# Patient Record
Sex: Male | Born: 1960 | Race: Black or African American | Hispanic: No | Marital: Married | Smoking: Former smoker
Health system: Southern US, Community
[De-identification: ages and names within clinical notes are randomized; demographics above are authoritative.]

## PROBLEM LIST (undated history)

## (undated) DIAGNOSIS — D376 Neoplasm of uncertain behavior of liver, gallbladder and bile ducts: Secondary | ICD-10-CM

## (undated) DIAGNOSIS — K648 Other hemorrhoids: Secondary | ICD-10-CM

## (undated) DIAGNOSIS — E291 Testicular hypofunction: Secondary | ICD-10-CM

## (undated) DIAGNOSIS — I2510 Atherosclerotic heart disease of native coronary artery without angina pectoris: Secondary | ICD-10-CM

## (undated) DIAGNOSIS — G4733 Obstructive sleep apnea (adult) (pediatric): Secondary | ICD-10-CM

## (undated) DIAGNOSIS — E785 Hyperlipidemia, unspecified: Secondary | ICD-10-CM

## (undated) DIAGNOSIS — K649 Unspecified hemorrhoids: Secondary | ICD-10-CM

## (undated) DIAGNOSIS — K219 Gastro-esophageal reflux disease without esophagitis: Secondary | ICD-10-CM

## (undated) DIAGNOSIS — K5730 Diverticulosis of large intestine without perforation or abscess without bleeding: Secondary | ICD-10-CM

## (undated) DIAGNOSIS — I10 Essential (primary) hypertension: Secondary | ICD-10-CM

## (undated) DIAGNOSIS — E669 Obesity, unspecified: Secondary | ICD-10-CM

## (undated) DIAGNOSIS — I219 Acute myocardial infarction, unspecified: Secondary | ICD-10-CM

## (undated) HISTORY — DX: Acute myocardial infarction, unspecified: I21.9

## (undated) HISTORY — DX: Obstructive sleep apnea (adult) (pediatric): G47.33

## (undated) HISTORY — DX: Testicular hypofunction: E29.1

## (undated) HISTORY — DX: Diverticulosis of large intestine without perforation or abscess without bleeding: K57.30

## (undated) HISTORY — PX: COLONOSCOPY: SHX174

## (undated) HISTORY — DX: Essential (primary) hypertension: I10

## (undated) HISTORY — DX: Other hemorrhoids: K64.8

## (undated) HISTORY — DX: Obesity, unspecified: E66.9

## (undated) HISTORY — PX: CHOLECYSTECTOMY: SHX55

## (undated) HISTORY — DX: Hyperlipidemia, unspecified: E78.5

## (undated) HISTORY — DX: Gastro-esophageal reflux disease without esophagitis: K21.9

## (undated) HISTORY — DX: Atherosclerotic heart disease of native coronary artery without angina pectoris: I25.10

## (undated) HISTORY — DX: Neoplasm of uncertain behavior of liver, gallbladder and bile ducts: D37.6

## (undated) HISTORY — DX: Unspecified hemorrhoids: K64.9

---

## 2005-08-25 HISTORY — PX: CORONARY ANGIOPLASTY WITH STENT PLACEMENT: SHX49

## 2006-06-25 DIAGNOSIS — I2510 Atherosclerotic heart disease of native coronary artery without angina pectoris: Secondary | ICD-10-CM | POA: Insufficient documentation

## 2006-06-25 DIAGNOSIS — Z9861 Coronary angioplasty status: Secondary | ICD-10-CM

## 2006-07-07 ENCOUNTER — Inpatient Hospital Stay (HOSPITAL_COMMUNITY): Admission: RE | Admit: 2006-07-07 | Discharge: 2006-07-08 | Payer: Self-pay | Admitting: Cardiology

## 2006-07-07 ENCOUNTER — Ambulatory Visit: Payer: Self-pay | Admitting: Cardiology

## 2006-07-10 ENCOUNTER — Ambulatory Visit: Payer: Self-pay | Admitting: *Deleted

## 2006-07-11 ENCOUNTER — Inpatient Hospital Stay (HOSPITAL_COMMUNITY): Admission: EM | Admit: 2006-07-11 | Discharge: 2006-07-12 | Payer: Self-pay | Admitting: Emergency Medicine

## 2006-07-23 ENCOUNTER — Encounter (HOSPITAL_COMMUNITY): Admission: RE | Admit: 2006-07-23 | Discharge: 2006-10-21 | Payer: Self-pay | Admitting: Cardiology

## 2006-08-03 ENCOUNTER — Ambulatory Visit: Payer: Self-pay | Admitting: Cardiology

## 2006-09-18 ENCOUNTER — Encounter: Payer: Self-pay | Admitting: Cardiology

## 2006-09-18 ENCOUNTER — Ambulatory Visit: Payer: Self-pay

## 2006-10-15 ENCOUNTER — Ambulatory Visit: Payer: Self-pay | Admitting: Cardiology

## 2007-05-24 ENCOUNTER — Ambulatory Visit: Payer: Self-pay | Admitting: Cardiology

## 2007-06-21 ENCOUNTER — Ambulatory Visit: Payer: Self-pay | Admitting: Cardiology

## 2007-06-24 ENCOUNTER — Ambulatory Visit: Payer: Self-pay

## 2008-01-10 ENCOUNTER — Ambulatory Visit: Payer: Self-pay | Admitting: Cardiology

## 2008-01-24 ENCOUNTER — Encounter: Payer: Self-pay | Admitting: Emergency Medicine

## 2008-01-24 ENCOUNTER — Inpatient Hospital Stay (HOSPITAL_COMMUNITY): Admission: AD | Admit: 2008-01-24 | Discharge: 2008-01-26 | Payer: Self-pay | Admitting: Internal Medicine

## 2008-01-24 ENCOUNTER — Encounter: Payer: Self-pay | Admitting: Internal Medicine

## 2008-01-24 DIAGNOSIS — D376 Neoplasm of uncertain behavior of liver, gallbladder and bile ducts: Secondary | ICD-10-CM | POA: Insufficient documentation

## 2008-01-24 DIAGNOSIS — K5732 Diverticulitis of large intestine without perforation or abscess without bleeding: Secondary | ICD-10-CM | POA: Insufficient documentation

## 2008-06-07 ENCOUNTER — Ambulatory Visit: Payer: Self-pay | Admitting: Cardiology

## 2008-07-10 ENCOUNTER — Ambulatory Visit: Payer: Self-pay | Admitting: Internal Medicine

## 2008-07-12 ENCOUNTER — Telehealth: Payer: Self-pay | Admitting: Internal Medicine

## 2008-07-18 ENCOUNTER — Encounter: Payer: Self-pay | Admitting: Internal Medicine

## 2008-07-31 ENCOUNTER — Emergency Department (HOSPITAL_BASED_OUTPATIENT_CLINIC_OR_DEPARTMENT_OTHER): Admission: EM | Admit: 2008-07-31 | Discharge: 2008-07-31 | Payer: Self-pay | Admitting: Emergency Medicine

## 2009-04-04 ENCOUNTER — Encounter (INDEPENDENT_AMBULATORY_CARE_PROVIDER_SITE_OTHER): Payer: Self-pay | Admitting: *Deleted

## 2009-05-12 ENCOUNTER — Ambulatory Visit: Payer: Self-pay | Admitting: Diagnostic Radiology

## 2009-05-12 ENCOUNTER — Emergency Department (HOSPITAL_BASED_OUTPATIENT_CLINIC_OR_DEPARTMENT_OTHER): Admission: EM | Admit: 2009-05-12 | Discharge: 2009-05-12 | Payer: Self-pay | Admitting: Emergency Medicine

## 2009-05-22 ENCOUNTER — Encounter: Payer: Self-pay | Admitting: Cardiology

## 2009-05-25 DIAGNOSIS — K219 Gastro-esophageal reflux disease without esophagitis: Secondary | ICD-10-CM | POA: Insufficient documentation

## 2009-05-25 DIAGNOSIS — I10 Essential (primary) hypertension: Secondary | ICD-10-CM | POA: Insufficient documentation

## 2009-05-25 DIAGNOSIS — E669 Obesity, unspecified: Secondary | ICD-10-CM | POA: Insufficient documentation

## 2009-05-25 DIAGNOSIS — I219 Acute myocardial infarction, unspecified: Secondary | ICD-10-CM | POA: Insufficient documentation

## 2009-05-30 ENCOUNTER — Encounter: Payer: Self-pay | Admitting: Cardiology

## 2009-05-30 ENCOUNTER — Ambulatory Visit: Payer: Self-pay | Admitting: Cardiology

## 2009-06-01 ENCOUNTER — Telehealth: Payer: Self-pay | Admitting: Cardiology

## 2009-06-04 LAB — CONVERTED CEMR LAB
ALT: 44 units/L (ref 0–53)
AST: 33 units/L (ref 0–37)
Albumin: 4.4 g/dL (ref 3.5–5.2)
Alkaline Phosphatase: 97 units/L (ref 39–117)
CO2: 25 meq/L (ref 19–32)
Calcium: 9.8 mg/dL (ref 8.4–10.5)
Glucose, Bld: 103 mg/dL — ABNORMAL HIGH (ref 70–99)
Potassium: 4.9 meq/L (ref 3.5–5.3)
Sodium: 141 meq/L (ref 135–145)
Total Protein: 7.5 g/dL (ref 6.0–8.3)

## 2009-07-03 ENCOUNTER — Telehealth: Payer: Self-pay | Admitting: Cardiology

## 2010-03-27 ENCOUNTER — Telehealth: Payer: Self-pay | Admitting: Cardiology

## 2010-05-27 ENCOUNTER — Encounter: Payer: Self-pay | Admitting: Emergency Medicine

## 2010-05-27 ENCOUNTER — Ambulatory Visit: Payer: Self-pay | Admitting: Diagnostic Radiology

## 2010-05-28 ENCOUNTER — Ambulatory Visit: Payer: Self-pay | Admitting: Internal Medicine

## 2010-05-28 ENCOUNTER — Observation Stay (HOSPITAL_COMMUNITY): Admission: EM | Admit: 2010-05-28 | Discharge: 2010-05-28 | Payer: Self-pay | Admitting: Cardiology

## 2010-06-03 ENCOUNTER — Telehealth (INDEPENDENT_AMBULATORY_CARE_PROVIDER_SITE_OTHER): Payer: Self-pay | Admitting: Radiology

## 2010-06-04 ENCOUNTER — Encounter: Payer: Self-pay | Admitting: Internal Medicine

## 2010-06-04 ENCOUNTER — Ambulatory Visit: Payer: Self-pay

## 2010-06-04 ENCOUNTER — Encounter (HOSPITAL_COMMUNITY)
Admission: RE | Admit: 2010-06-04 | Discharge: 2010-06-21 | Payer: Self-pay | Source: Home / Self Care | Admitting: Cardiology

## 2010-06-04 ENCOUNTER — Ambulatory Visit: Payer: Self-pay | Admitting: Internal Medicine

## 2010-06-19 ENCOUNTER — Encounter: Payer: Self-pay | Admitting: Cardiology

## 2010-06-19 ENCOUNTER — Ambulatory Visit: Payer: Self-pay | Admitting: Cardiology

## 2010-08-01 ENCOUNTER — Emergency Department (HOSPITAL_BASED_OUTPATIENT_CLINIC_OR_DEPARTMENT_OTHER): Admission: EM | Admit: 2010-08-01 | Discharge: 2009-11-06 | Payer: Self-pay | Admitting: Emergency Medicine

## 2010-08-08 ENCOUNTER — Telehealth: Payer: Self-pay | Admitting: Internal Medicine

## 2010-09-15 ENCOUNTER — Encounter: Payer: Self-pay | Admitting: Internal Medicine

## 2010-09-24 NOTE — Assessment & Plan Note (Signed)
Summary: Henry Perry   Visit Type:  Follow-up Primary Provider:  [facility]  CC:  No complaints.  History of Present Illness: Mr. Dennington is a pleasant 50 year old male with cardiac history dating back to November 2007.  He underwent cardiac catheterization at that time secondary to a myocardial infarction.  He was found to have an ejection fraction of 65%.  He had a proximal 95% stenosis in his right coronary artery and the distal vessel was occluded.  He subsequently underwent PCI of his right coronary artery using 3 drug-eluting stents, 2 of which were overlapping.  He had nonobstructive disease in his LAD with a 50% mid and a 40% lesion further downstream.  He had a 50% second diagonal as well.  Admitted to [facility] 10/11 with atypical chest pain. A Myoview was performed following DC in October of 2011.  At that time, his ejection fraction was 56%. No ischemia or infarction. Last echocardiogram in January of 2008 revealed normal LV function. Since then he denies any dyspnea on exertion, orthopnea, PND, pedal edema, palpitations, syncope or chest pain.  Current Medications (verified): 1)  Plavix 75 Mg Tabs (Clopidogrel Bisulfate) .... Once Daily 2)  Simvastatin 80 Mg Tabs (Simvastatin) .... Take 1/2 Tablet Daily 3)  Carvedilol 25 Mg Tabs (Carvedilol) .... Two Times A Day 4)  Aspirin 81 Mg Tabs (Aspirin) .... Once Daily 5)  Lisinopril 20 Mg Tabs (Lisinopril) .... Take 1 Tablet By Mouth Once A Day 6)  Metformin Hcl 500 Mg Tabs (Metformin Hcl) .... Take 1 Tablet By Mouth Two Times A Day 7)  Clear-Atadine D 10-240 Mg Xr24h-Tab (Loratadine-Pseudoephedrine) .... As Needed  Allergies (verified): No Known Drug Allergies  Past History:  Past Medical History: HYPERLIPIDEMIA (ICD-272.4) HYPERTENSION (ICD-401.9) CAD (ICD-414.00) MI (ICD-410.90) Hx of DIVERTICULITIS, ACUTE (ICD-562.11) LIVER MASS (ICD-235.3) HEMORRHOIDS (ICD-455.6) GERD (ICD-530.81) OBESITY  (ICD-278.00) diabetes non insulin dependent  Past Surgical History: Cholecystectomy  Social History: Reviewed history from 07/10/2008 and no changes required. 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  Alcohol Use - no Daily Caffeine Use -1 Illicit Drug Use - no Married, 1 son, 1 daughter  Review of Systems       no fevers or chills, productive cough, hemoptysis, dysphasia, odynophagia, melena, hematochezia, dysuria, hematuria, rash, seizure activity, orthopnea, PND, pedal edema, claudication. Remaining systems are negative.   Vital Signs:  Patient profile:   50 year old male Height:      67 inches Weight:      220.75 pounds BMI:     34.70 Pulse rate:   72 / minute Pulse rhythm:   regular Resp:     18 per minute BP sitting:   116 / 80  (left arm) Cuff size:   large  Vitals Entered By: Vikki Ports (June 19, 2010 9:44 AM)  Physical Exam  General:  Well-developed well-nourished in no acute distress.  Skin is warm and dry.  HEENT is normal.  Neck is supple. No thyromegaly.  Chest is clear to auscultation with normal expansion.  Cardiovascular exam is regular rate and rhythm.  Abdominal exam nontender or distended. No masses palpated. Extremities show no edema. neuro grossly intact    Impression & Recommendations:  Problem # 1:  HYPERLIPIDEMIA (ICD-272.4) Continue statin. His updated medication list for this problem includes:    Simvastatin 80 Mg Tabs (Simvastatin) .Marland Kitchen... Take 1/2 tablet daily  Problem # 2:  HYPERTENSION (ICD-401.9) Blood pressure controlled on present medications. Will continue. His updated medication list  for this problem includes:    Carvedilol 25 Mg Tabs (Carvedilol) .Marland Kitchen..Marland Kitchen Two times a day    Aspirin 81 Mg Tabs (Aspirin) ..... Once daily    Lisinopril 20 Mg Tabs (Lisinopril) .Marland Kitchen... Take 1 tablet by mouth once a day  Problem # 3:  CAD (ICD-414.00) Continue aspirin, Plavix, beta blocker, ACE inhibitor and  statin. His updated medication list for this problem includes:    Plavix 75 Mg Tabs (Clopidogrel bisulfate) ..... Once daily    Carvedilol 25 Mg Tabs (Carvedilol) .Marland Kitchen..Marland Kitchen Two times a day    Aspirin 81 Mg Tabs (Aspirin) ..... Once daily    Lisinopril 20 Mg Tabs (Lisinopril) .Marland Kitchen... Take 1 tablet by mouth once a day  Problem # 4:  GERD (ICD-530.81)  Patient Instructions: 1)  Your physician recommends that you schedule a follow-up appointment in: 6 MONTHS

## 2010-09-24 NOTE — Assessment & Plan Note (Signed)
Summary: Cardiology Nuclear Testing  Nuclear Med Background Indications for Stress Test: Evaluation for Ischemia, Post Hospital  Indications Comments: 05/28/10 [facility] Chest pain  History: Echo, Myocardial Infarction, Myocardial Perfusion Study, Stents  History Comments: '08 [facility]: No ischemia.  Symptoms: Chest Pain  Symptoms Comments: Last episode of RJ:JOAC since d/c   Nuclear Pre-Procedure Cardiac Risk Factors: History of Smoking, Hypertension, Lipids, NIDDM Caffeine/Decaff Intake: NONE NPO After: 7:30 PM Lungs: Clear IV 0.9% NS with Angio Cath: 22g     IV Site: R Hand IV Started by: Doyne Keel, CNMT Chest Size (in) 48     Height (in): 67 Weight (lb): 215 BMI: 33.80 Tech Comments: HELD COREG SINCE 3P YESTERDAY/ TOOK METFORMIN THIS AM  Nuclear Med Study 1 or 2 day study:  1 day     Stress Test Type:  Stress Reading MD:  Arvilla Meres, MD     Referring MD:  Olga Millers, MD Resting Radionuclide:  Technetium 13m Tetrofosmin     Resting Radionuclide Dose:  10.9 mCi  Stress Radionuclide:  Technetium 22m Tetrofosmin     Stress Radionuclide Dose:  33.0 mCi   Stress Protocol Exercise Time (min):  10:46 min     Max HR:  162 bpm     Predicted Max HR:  174 bpm  Max Systolic BP: 211 mm Hg     Percent Max HR:  93.10 %     METS: 12.8 Rate Pressure Product:  16606    Stress Test Technologist:  Cathlyn Parsons, RN     Nuclear Technologist:  Domenic Polite, CNMT  Rest Procedure  Myocardial perfusion imaging was performed at rest 45 minutes following the intravenous administration of Technetium 29m Tetrofosmin.  Stress Procedure  The patient exercised for 10:46.  The patient stopped due to fatigue and denied any chest pain.  There were no significant ST-T wave changes.  He did have a hypertensive response to exercise, 211/94 and 180/112.  He had held his Coreg x 18 hours.  Technetium 16m Tetrofosmin was injected at peak exercise and myocardial perfusion imaging was performed after  a brief delay.  QPS Raw Data Images:  Normal; no motion artifact; normal heart/lung ratio. Stress Images:  Normal homogeneous uptake in all areas of the myocardium. Rest Images:  Normal homogeneous uptake in all areas of the myocardium. Subtraction (SDS):  Normal Transient Ischemic Dilatation:  Marland KitchenMarland Kitchen98  (Normal <1.22)  Lung/Heart Ratio:  .31  (Normal <0.45)  Quantitative Gated Spect Images QGS EDV:  100 ml QGS ESV:  44 ml QGS EF:  56 % QGS cine images:  Normal  Findings Normal nuclear study      Overall Impression  Exercise Capacity: Good exercise capacity. BP Response: Hypertensive blood pressure response. Clinical Symptoms: No chest pain. + dyspnea ECG Impression: No significant ST segment change suggestive of ischemia. Overall Impression: Normal stress nuclear study.  Appended Document: Cardiology Nuclear Testing ok  Appended Document: Cardiology Nuclear Testing lmtcb./cy  Appended Document: Cardiology Nuclear Testing pt aware./cy

## 2010-09-24 NOTE — Progress Notes (Signed)
Summary: Nuc Pre-Procedure  Phone Note Outgoing Call Call back at Home Phone [phone]   Call placed by: Langley Adie Call placed to: Patient Reason for Call: Confirm/change Appt Summary of Call: Left message with information on Myoview Information Sheet (see scanned document for details).      Nuclear Med Background Indications for Stress Test: Evaluation for Ischemia, Post Hospital  Indications Comments: [facility] Chest [facility]  History: Echo, Myocardial Infarction, Myocardial Perfusion Study, Stents  History Comments: 2007- MI treated with Strent-RCA 2008- [facility]- No ischemia. Nl. EF  Symptoms: Chest Pain    Nuclear Pre-Procedure Cardiac Risk Factors: History of Smoking, Hypertension, Lipids, NIDDM Height (in): 67

## 2010-09-24 NOTE — Progress Notes (Signed)
Summary: pt needs letter for new insurance  Phone Note Call from Patient Call back at Home Phone [phone] Call back at [phone]   Caller: Patient Summary of Call: pt is getting new insurance and needs a letter faxed to him giving his Dx so he can submit it to new insurance company fax# [phone]   Initial call taken by: Omer Jack,  March 27, 2010 11:27 AM  Follow-up for Phone Call        last office note faxed to number provided. pt aware Deliah Goody, RN  March 27, 2010 12:03 PM\par

## 2010-09-26 NOTE — Progress Notes (Signed)
Summary: refill request fax to pt  Phone Note Refill Request Message from:  Patient  Refills Requested: Medication #1:  PLAVIX 75 MG TABS once daily 90 day supply faxed to pt [phone]  Initial call taken by: Glynda Jaeger,  August 08, 2010 1:24 PM  Follow-up for Phone Call        could not get in touch with pt...left message mailing to pt Follow-up by: Hardin Negus, RMA,  August 09, 2010 4:52 PM    Prescriptions: PLAVIX 75 MG TABS (CLOPIDOGREL BISULFATE) once daily  #90 x 3   Entered by:   Hardin Negus, RMA   Authorized by:   Dolores Patty, MD, [facility]   Signed by:   Hardin Negus, RMA on 08/09/2010   Method used:   Print then Give to Patient   RxID:   [id number]   Appended Document: refill request fax to pt pt called back and ask that rx be sent into [facility], rx sent in   Clinical Lists Changes  Medications: Rx of PLAVIX 75 MG TABS (CLOPIDOGREL BISULFATE) once daily;  #90 x 3;  Signed;  Entered by: Meredith Staggers, RN;  Authorized by: Dolores Patty, MD, [facility];  Method used: Electronically to [id number] [address] [place], [place], [place]  [address], Ph: [phone], Fax: [phone]    Prescriptions: PLAVIX 75 MG TABS (CLOPIDOGREL BISULFATE) once daily  #90 x 3   Entered by:   Meredith Staggers, RN   Authorized by:   Dolores Patty, MD, [facility]   Signed by:   Meredith Staggers, RN on 08/12/2010   Method used:   Electronically to        [facility] [phone]* (retail)       [address] [place], [place]  [address]       Ph: [phone]       Fax: [phone]   RxID:   [id number]

## 2010-11-07 LAB — BASIC METABOLIC PANEL
CO2: 29 mEq/L (ref 19–32)
Calcium: 9.6 mg/dL (ref 8.4–10.5)
GFR calc Af Amer: >60 mL/min (ref >60)
GFR calc non Af Amer: >60 mL/min (ref >60)
Glucose, Bld: 139 mg/dL — ABNORMAL HIGH (ref 70–99)
Potassium: 4 mEq/L (ref 3.5–5.1)
Sodium: 143 mEq/L (ref 135–145)

## 2010-11-07 LAB — CBC
HCT: 45 % (ref 39.0–52.0)
Hemoglobin: 14.8 g/dL (ref 13.0–17.0)
MCH: 28.5 pg (ref 26.0–34.0)
RBC: 5.18 MIL/uL (ref 4.22–5.81)

## 2010-11-07 LAB — POCT CARDIAC MARKERS
CKMB, poc: <1 ng/mL — ABNORMAL LOW (ref 1.0–8.0)
CKMB, poc: <1 ng/mL — ABNORMAL LOW (ref 1.0–8.0)
Myoglobin, poc: 54 ng/mL (ref 12–200)
Troponin i, poc: <0.05 ng/mL (ref 0.00–0.09)

## 2010-11-07 LAB — HEMOGLOBIN A1C
Hgb A1c MFr Bld: 7.3 % — ABNORMAL HIGH (ref <5.7)
Mean Plasma Glucose: 163 mg/dL — ABNORMAL HIGH (ref <117)

## 2010-11-07 LAB — URINALYSIS, ROUTINE W REFLEX MICROSCOPIC
Glucose, UA: NEGATIVE mg/dL
Hgb urine dipstick: NEGATIVE
pH: 7 (ref 5.0–8.0)

## 2010-11-07 LAB — GLUCOSE, CAPILLARY: Glucose-Capillary: 104 mg/dL — ABNORMAL HIGH (ref 70–99)

## 2010-11-07 LAB — CARDIAC PANEL(CRET KIN+CKTOT+MB+TROPI)
CK, MB: 1.1 ng/mL (ref 0.3–4.0)
CK, MB: 1.2 ng/mL (ref 0.3–4.0)
Relative Index: 0.6 (ref 0.0–2.5)
Total CK: 200 U/L (ref 7–232)

## 2010-11-07 LAB — POCT TOXICOLOGY PANEL

## 2010-11-07 LAB — LIPID PANEL: HDL: 33 mg/dL — ABNORMAL LOW (ref >39)

## 2010-11-20 ENCOUNTER — Encounter: Payer: Self-pay | Admitting: Cardiology

## 2010-12-04 ENCOUNTER — Ambulatory Visit (INDEPENDENT_AMBULATORY_CARE_PROVIDER_SITE_OTHER): Payer: PRIVATE HEALTH INSURANCE | Admitting: Cardiology

## 2010-12-04 ENCOUNTER — Encounter: Payer: Self-pay | Admitting: Cardiology

## 2010-12-04 ENCOUNTER — Ambulatory Visit: Payer: Self-pay | Admitting: Cardiology

## 2010-12-04 VITALS — BP 132/96 | HR 60 | Resp 18 | Ht 66.0 in | Wt 229.0 lb

## 2010-12-04 DIAGNOSIS — I10 Essential (primary) hypertension: Secondary | ICD-10-CM

## 2010-12-04 DIAGNOSIS — I2510 Atherosclerotic heart disease of native coronary artery without angina pectoris: Secondary | ICD-10-CM

## 2010-12-04 MED ORDER — LISINOPRIL 40 MG PO TABS: 40.0000 mg | ORAL_TABLET | Freq: Every day | ORAL | Status: DC

## 2010-12-04 NOTE — Assessment & Plan Note (Signed)
Continue aspirin but discontinue Plavix. Continue beta blocker, ACE inhibitor and statin. Continue risk factor modification.

## 2010-12-04 NOTE — Patient Instructions (Signed)
STOP PLAVIX  INCREASE LISINOPRIL TO 40MG  ONCE DAILY  LAB WORK-FASTING IN ONE WEEK  Your physician recommends that you schedule a follow-up appointment in: ONE YEAR

## 2010-12-04 NOTE — Assessment & Plan Note (Signed)
Blood pressure elevated. Increase lisinopril to 40 mg daily. Check potassium and renal function in one week. 

## 2010-12-04 NOTE — Progress Notes (Signed)
HPI: Henry Perry is a pleasant 50 year old male with cardiac history dating back to November 2007.  He underwent cardiac catheterization at that time secondary to a myocardial infarction.  He was found to have an ejection fraction of 65%.  He had a proximal 95% stenosis in his right coronary artery and the distal vessel was occluded.  He subsequently underwent PCI of his right coronary artery using 3 drug-eluting stents, 2 of which were overlapping.  He had nonobstructive disease in his LAD with a 50% mid and a 40% lesion further downstream.  He had a 50% second diagonal as well.  Admitted to [facility] 10/11 with atypical chest pain. A Myoview was performed following DC in October of 2011.  At that time, his ejection fraction was 56%. No ischemia or infarction. Last echocardiogram in January of 2008 revealed normal LV function. Since I last saw him in Oct 2011, the patient denies any dyspnea on exertion, orthopnea, PND, pedal edema, palpitations, syncope or chest pain.   Current Outpatient Prescriptions  Medication Sig Dispense Refill  . aspirin 81 MG tablet Take 81 mg by mouth daily.        . carvedilol (COREG) 25 MG tablet Take 25 mg by mouth 2 (two) times daily with a meal.        . lisinopril (PRINIVIL,ZESTRIL) 20 MG tablet Take 30 mg by mouth daily.       . metFORMIN (GLUMETZA) 500 MG (MOD) 24 hr tablet Take 500 mg by mouth 2 (two) times daily with a meal.        . simvastatin (ZOCOR) 80 MG tablet 80 mg. 1/2 po daily       . testosterone (ANDRODERM) 2.5 MG/24HR Place 1 patch onto the skin every other day.        Marland Kitchen DISCONTD: clopidogrel (PLAVIX) 75 MG tablet Take 75 mg by mouth daily.        . Loratadine-Pseudoephedrine (CLEAR-ATADINE D PO)          Past Medical History  Diagnosis Date  . Hyperlipidemia   . Hypertension   . Coronary atherosclerosis of unspecified type of vessel, native or graft   . Acute myocardial infarction, unspecified site, episode of care unspecified   . Diverticulitis of  colon (without mention of hemorrhage)   . 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 Unspecified hemorrhoids without mention of complication   . Esophageal reflux   . Obesity, unspecified   . Heart murmur     non insulin dependent    Past Surgical History  Procedure Date  . Cholecystectomy     History   Social History  . Marital Status: Married    Spouse Name: N/A    Number of Children: 2  . Years of Education: N/A   Occupational History  . Buisness owner     real estate   Social History Main Topics  . Smoking status: Former Games developer  . Smokeless tobacco: Not on file  . Alcohol Use: No  . Drug Use: No  . Sexually Active:    Other Topics Concern  . 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 Alcohol Use - noDaily Caffeine Use -1Illicit Drug Use - noMarried, 1 son, 1 daughter    ROS: no fevers or chills, productive cough, hemoptysis, dysphasia, odynophagia, melena, hematochezia, dysuria, hematuria, rash, seizure activity, orthopnea, PND, pedal edema, claudication. Remaining systems are negative.  Physical  Exam: Well-developed well-nourished in no acute distress.  Skin is warm and dry.  HEENT is normal.  Neck is supple. No thyromegaly.  Chest is clear to auscultation with normal expansion.  Cardiovascular exam is regular rate and rhythm.  Abdominal exam nontender or distended. No masses palpated. Extremities show no edema. neuro grossly intact  ECG Normal sinus rhythm, prior inferior infarct.

## 2010-12-04 NOTE — Assessment & Plan Note (Signed)
Continue statin. Check lipids and liver. 

## 2011-01-06 ENCOUNTER — Encounter: Payer: Self-pay | Admitting: *Deleted

## 2011-01-07 ENCOUNTER — Encounter: Payer: Self-pay | Admitting: Family Medicine

## 2011-01-07 ENCOUNTER — Telehealth: Payer: Self-pay | Admitting: Family Medicine

## 2011-01-07 ENCOUNTER — Ambulatory Visit (INDEPENDENT_AMBULATORY_CARE_PROVIDER_SITE_OTHER): Payer: PRIVATE HEALTH INSURANCE | Admitting: Family Medicine

## 2011-01-07 DIAGNOSIS — I10 Essential (primary) hypertension: Secondary | ICD-10-CM

## 2011-01-07 DIAGNOSIS — Z125 Encounter for screening for malignant neoplasm of prostate: Secondary | ICD-10-CM

## 2011-01-07 DIAGNOSIS — E669 Obesity, unspecified: Secondary | ICD-10-CM

## 2011-01-07 DIAGNOSIS — E119 Type 2 diabetes mellitus without complications: Secondary | ICD-10-CM | POA: Insufficient documentation

## 2011-01-07 DIAGNOSIS — I2510 Atherosclerotic heart disease of native coronary artery without angina pectoris: Secondary | ICD-10-CM

## 2011-01-07 DIAGNOSIS — E785 Hyperlipidemia, unspecified: Secondary | ICD-10-CM

## 2011-01-07 LAB — PSA: PSA: 1.63 ng/mL (ref 0.10–4.00)

## 2011-01-07 LAB — COMPREHENSIVE METABOLIC PANEL
ALT: 49 U/L (ref 0–53)
AST: 31 U/L (ref 0–37)
Calcium: 9.9 mg/dL (ref 8.4–10.5)
Chloride: 105 mEq/L (ref 96–112)
Creatinine, Ser: 1 mg/dL (ref 0.4–1.5)
Potassium: 4.6 mEq/L (ref 3.5–5.1)
Sodium: 142 mEq/L (ref 135–145)

## 2011-01-07 LAB — CBC WITH DIFFERENTIAL/PLATELET
Basophils Absolute: 0 10*3/uL (ref 0.0–0.1)
Eosinophils Absolute: 0.5 10*3/uL (ref 0.0–0.7)
Lymphocytes Relative: 43.7 % (ref 12.0–46.0)
MCHC: 33 g/dL (ref 30.0–36.0)
Neutro Abs: 2.1 10*3/uL (ref 1.4–7.7)
Neutrophils Relative %: 39.7 % — ABNORMAL LOW (ref 43.0–77.0)
Platelets: 235 10*3/uL (ref 150.0–400.0)
RDW: 14.5 % (ref 11.5–14.6)

## 2011-01-07 MED ORDER — SIMVASTATIN 80 MG PO TABS: ORAL_TABLET | ORAL | Status: DC

## 2011-01-07 NOTE — Assessment & Plan Note (Signed)
[facility]                            CARDIOLOGY OFFICE NOTE   Henry Perry, Henry Perry                        MRN:          [id number]  DATE:06/21/2007                            DOB:          [birth year]    PRIMARY CARE PHYSICIAN:  Dr. Elease Hashimoto at the [facility].   REASON FOR VISIT:  Recurrent chest pain.   HISTORY OF PRESENT ILLNESS:  I saw Henry Perry recently in late  September.  He called the office back on the 24th describing an episode  of chest pain and was scheduled to see me today.  Henry Perry states  that he lifted a fairly heavy box late last week without any  symptomatology.  Although, approximately 1 hour later when he was  sitting down he noticed a gas-like chest discomfort.  This persisted  and he ultimately went home and took a sublingual nitroglycerin tablet.  The total duration of his symptoms was about 1 hour.  He notes that the  symptoms seemed to be worse when he would touch his chest in the central  sternal area.  They were somewhat reminiscent of his prior angina  although not the same.  In retrospect, he also described some general  soreness of his jaws over the last 2 weeks but does mention that this  seemed to be a little bit better after taking some mouthwash.  His  electrocardiogram today in the office is normal, showing sinus rhythm at  68 beats per minute.  He has otherwise been compliant with his  medications.   ALLERGIES:  No known drug allergies.   PRESENT MEDICATIONS:  1. Coreg 25 mg p.o. b.i.d.  2. Aspirin 81 mg p.o. daily.  3. Plavix 75 mg p.o. daily.  4. Simvastatin 80 mg p.o. at bedtime.  5. Lisinopril 20 mg p.o. daily.  6. Sublingual nitroglycerin 0.4 mg p.r.n.   REVIEW OF SYSTEMS:  As described in the History of Present Illness.  Otherwise negative.   EXAMINATION:  Blood pressure today is 139/90, heart rate is 64, weight  is 229 pounds.  This is an overweight  male in no acute distress.  No  active chest pain or shortness of breath.  HEENT:  Conjunctiva looks normal.  OROPHARYNX:  Clear.  NECK:  Supple.  No elevated jugular venous pressure, no loud bruits, no  thyromegaly is noted.  LUNGS:  Clear without labored breathing at rest.  CARDIAC EXAM:  A regular rate and rhythm, no S3 gallop or pericardial  rub.  ABDOMEN:  Soft, nontender.  EXTREMITIES:  No significant pitting edema.  SKIN:  Warm and dry.  MUSCULOSKELETAL:  No kyphosis is noted.  NEUROPSYCHIATRIC:  The patient is alert and oriented x3, affect is  normal.   IMPRESSION AND RECOMMENDATION:  1. Recent atypical, chest discomfort as outlined.  This is in the      setting of known cardiovascular disease status post inferior wall      myocardial infarction in November of 2007, managed with overlapping      drug-eluting stents in the right coronary artery  extending into the      proximal posterior descending branch.  Electrocardiogram today is      normal and the patient is symptom free.  Our plan will be to      continue medical therapy and schedule a followup Myoview.  If this      is low risk and he remains symptomatically stable, will plan to      continue medical therapy and I will see him back over the next 6      months.  Otherwise, I will bring him back in to discuss situation      further.  2. Obesity.  Henry Perry states that he has been trying to work on his      diet.  We spoke about this today and I also encouraged him to try      to get back towards a regular exercise regimen which he had been on      in the past.  I have asked him to wait until his Myoview results      until pushing further, however.     Henry Sidle, MD  Electronically Signed    SGM/MedQ  DD: 06/21/2007  DT: 06/21/2007  Job #: [id number]

## 2011-01-07 NOTE — Telephone Encounter (Signed)
Pls request records from the [facility].

## 2011-01-07 NOTE — Discharge Summary (Signed)
NAME:  Henry Perry, KATZENSTEIN NO.:  [id number]   MEDICAL RECORD NO.:  [id number]          PATIENT TYPE:  INP   LOCATION:  5507                         FACILITY:  [facility]   PHYSICIAN:  Elliot Cousin, M.D.    DATE OF BIRTH:  12-07-1960   DATE OF ADMISSION:  01/24/2008  DATE OF DISCHARGE:  01/26/2008                               DISCHARGE SUMMARY   DISCHARGE DIAGNOSES:  1. Acute diverticulitis with phlegmon.  2. A 3.8 x 2.3 cm left lobe of the liver mass.  3. Type 2 diabetes mellitus.  4. Coronary artery disease.   DISCHARGE MEDICATIONS:  1. Metformin 500 mg daily.  2. Cipro 500 mg b.i.d. for 10 more days.  3. Flagyl 500 mg t.i.d. for 10 more days.  4. Aspirin 81 mg daily.  5. Carvedilol 25 mg b.i.d.  6. Simvastatin 80 mg daily.  7. Lisinopril 20 mg daily.  8. Nitroglycerin sublingual p.r.n.  9. Plavix 75 mg daily.   DISCHARGE DISPOSITION:  The patient is being discharged to home in  improved and stable condition on January 26, 2008.  He was advised to follow  up with his primary care physician at the [facility] office in 5-7  days.   CONSULTATIONS:  None.   PROCEDURE PERFORMED:  CT scan of the abdomen and pelvis on January 24, 2008.  The results revealed acute diverticulitis involving the distal  descending colon.  Advanced inflammatory change with focal area of low  density within the region consistent with phlegmon formation.  Indeterminate mass within the dome of the liver is partially exophytic.  The mass measures 3.8 x 2.2 cm.   HISTORY OF PRESENT ILLNESS:  The patient is a 50 year old man with a  past medical history significant for coronary artery disease, type 2  diabetes mellitus, and status post cholecystectomy.  He presented to the  emergency department on January 24, 2008 with a chief complaint of left  lower quadrant abdominal pain.  The patient also complained of  constipation.  He had no complaints of nausea, vomiting, or bloody  stools.  When he was  evaluated in the emergency department, he was noted  to be afebrile and hemodynamically stable.  His white blood cell count  was within normal limits at 9.4.  The patient was admitted for further  evaluation and management.   For additional details please see the dictated history and physical.   HOSPITAL COURSE:  1. ACUTE DIVERTICULITIS.  The patient was started on empiric      antibiotic treatment with intravenous Cipro and Flagyl.  His pain      was managed with as-needed oxycodone and Dilaudid.  He was      initially n.p.o.; however, over the course of the following 24      hours, the patient began to eat solid foods.  As of today, the      patient has no complaints of abdominal pain even after eating      breakfast and lunch.  The patient's white blood cell count remained      within normal limits.  He also  remained afebrile.  Although, the CT      scan results may show in part a phlegmon, the patient does not      appear to be toxic at all.  He wants to go home today.  It is      reasonable to discharge him to home on 10 more days of oral Flagyl      and Cipro.  He was strongly advised to follow up with his primary      care physician in 5-7 days.  The patient voiced understanding.  2. LIVER MASS.  The patient had no complaints of right upper quadrant      abdominal pain.  His total bilirubin was slightly elevated at 1.3,      his alkaline phosphatase was slightly elevated at 127, his SGOT was      slightly elevated at 41, and his SGPT was within normal limits at      39.  Certainly, this is an incidental finding.  The patient will      need further evaluation in the outpatient setting with either      another CT scan or MRI in 6-8 weeks.  The patient voiced      understanding.  He did state that he will discuss the followup      imaging studies with his primary care physician at the [place] in      [place].  3. CORONARY ARTERY DISEASE:  The patient remained stable with regards       to coronary artery disease.  He was maintained on all of his      chronic cardiac medications.  4. TYPE 2 DIABETES MELLITUS:  The patient had been previously diet      controlled.  However, during the hospital course, his capillary      blood glucose had become mildly to moderately elevated.  His      hemoglobin A1c was found to be elevated at 8.0.  Although, the      patient was hesitant to start metformin, metformin was indeed      started during this hospitalization.  The patient was advised to      continue metformin at 500 mg daily.      Elliot Cousin, M.D.  Electronically Signed     DF/MEDQ  D:  01/26/2008  T:  01/27/2008  Job:  [id number]

## 2011-01-07 NOTE — Assessment & Plan Note (Addendum)
Gets regular f/u with Dr. Jens Som. Recently taken off plavix. Continue diet/exercise, beta blocker, ACE-I, and statin.

## 2011-01-07 NOTE — Progress Notes (Signed)
Office Note 01/07/2011  CC:  Chief Complaint  Patient presents with  . Establish Care    no problems    HPI:  Henry Perry is a 50 y.o. Black male who is here to establish care. Patient's most recent primary MD: NONE Old records were reviewed prior to or during today's visit (cardiology).  Henry Perry has never really had a primary care physician.  He had an MI a few years ago and has been seeing his cardiologist regulary.  He says all is well with this and he was recently taken off plavix after being on it a few years. He has developed good exercise habits: now does 60 min treadmill 3-5 days per week.  Now getting more disciplined regarding low fat/carb conscious diet and wants to see if he can get off of some of his medications. Glucose this am 103, says this is his usual lately.    Past Medical History  Diagnosis Date  . Hyperlipidemia   . Hypertension   . Coronary atherosclerosis of unspecified type of vessel, native or graft   . Acute myocardial infarction, unspecified site, episode of care unspecified   . Diverticulitis of colon (without mention of hemorrhage)   . 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 Unspecified hemorrhoids without mention of complication   . Esophageal reflux   . Obesity, unspecified   . Diabetes mellitus     non insulin dependent    Past Surgical History  Procedure Date  . Cholecystectomy   . Coronary angioplasty with stent placement     Family History  Problem Relation Age of Onset  . Prostate cancer Father   . Diabetes Father   . Cancer Father     prostate    History   Social History  . Marital Status: Married    Spouse Name: N/A    Number of Children: 2  . Years of Education: N/A   Occupational History  . Buisness owner     real estate   Social History Main Topics  . Smoking status: Former Smoker    Quit date: 08/25/1990  . Smokeless tobacco: Never Used  . Alcohol Use: No  . Drug Use: No  .  Sexually Active: Not on file   Other Topics Concern  . Not on file   Social History Narrative   Occupation: Psychologist, sport and exercise - Science writer is a former smoker (quit about age 7). Alcohol Use - noDaily Caffeine Use -1Illicit Drug Use - noMarried, 1 son, 1 daughter.  Lives in [place].  Originally from [place] but lived in [place] a while before moving back to [place].    Outpatient Encounter Prescriptions as of 01/07/2011  Medication Sig Dispense Refill  . aspirin 81 MG tablet Take 81 mg by mouth daily.        . carvedilol (COREG) 25 MG tablet Take 25 mg by mouth 2 (two) times daily with a meal.        . lisinopril (PRINIVIL,ZESTRIL) 40 MG tablet Take 1 tablet (40 mg total) by mouth daily.  90 tablet  4  . Loratadine-Pseudoephedrine (CLEAR-ATADINE D PO)       . metFORMIN (GLUMETZA) 500 MG (MOD) 24 hr tablet Take 500 mg by mouth 2 (two) times daily with a meal.        . simvastatin (ZOCOR) 80 MG tablet 1 tab po qd  90 tablet  3  . testosterone (ANDRODERM) 2.5 MG/24HR Place 1 patch onto the skin every  other day.        Marland Kitchen DISCONTD: simvastatin (ZOCOR) 80 MG tablet 80 mg. 1/2 po daily         No Known Allergies  ROS Review of Systems  Constitutional: Negative for fever, chills, appetite change and fatigue.  HENT: Negative for ear pain, congestion, sore throat, neck stiffness and dental problem.   Eyes: Negative for discharge, redness and visual disturbance.  Respiratory: Negative for cough, chest tightness, shortness of breath and wheezing.   Cardiovascular: Negative for chest pain, palpitations and leg swelling.  Gastrointestinal: Negative for nausea, vomiting, abdominal pain, diarrhea and blood in stool.  Genitourinary: Negative for dysuria, urgency, frequency, hematuria, flank pain and difficulty urinating.  Musculoskeletal: Negative for myalgias, back pain, joint swelling and arthralgias.  Skin: Negative for pallor and rash.  Neurological: Negative for dizziness, speech difficulty,  weakness and headaches.  Hematological: Negative for adenopathy. Does not bruise/bleed easily.  Psychiatric/Behavioral: Negative for confusion and sleep disturbance. The patient is not nervous/anxious.      PE; Blood pressure 138/91, pulse 76, temperature 97.4 F (36.3 C), temperature source Oral, height 5' 4.5" (1.638 m), weight 226 lb (102.513 kg), SpO2 96.00%. Gen: Alert, well appearing.  Patient is oriented to person, place, time, and situation. Neck: supple, ROM full.  Carotids 2+ bilat, without bruit.  No lymphadenopathy, thyromegaly, or mass. Chest: symmetric expansion, nonlabored respirations.  Clear and equal breath sounds in all lung fields.   CV: RRR, no m/r/g.  Peripheral pulses 2+ and symmetric. ABD: soft, NT, ND, BS normal.  No hepatospenomegaly or mass.  No bruits. EXT: no clubbing, cyanosis, or edema.   Pertinent labs:  none  ASSESSMENT AND PLAN:   Type II or unspecified type diabetes mellitus without mention of complication, not stated as uncontrolled Stable. HbA1c today. Patient wants to focus on attempt at wt loss/diet/exercise as his only mode of treatment for this, but we'll see what A1c is first. He has had DM nutrition classes in the distant past and seems well motivated to follow this and to exercise regularly (has already started).  OBESITY Pt. Has started exercise regimen and diet and has a goal wt of 275 lbs.  CAD Gets regular f/u with Dr. Jens Som. Recently taken off plavix. Continue diet/exercise, beta blocker, ACE-I, and statin.   Prostate cancer screening PSA done today (he recalls his last PSA being done over 1 yr ago.  His father had prostate cancer).  HYPERLIPIDEMIA Continue statin, continue diet/exercise/wt loss. Check NMR lipoprofile today (he is not fasting). Lab Results  Component Value Date   CHOL  Value: 143        ATP III CLASSIFICATION:  <200     mg/dL   Desirable  846-962  mg/dL   Borderline High  >=952    mg/dL   High         84/08/3242   Lab Results  Component Value Date   HDL 33* 05/28/2010   Lab Results  Component Value Date   LDLCALC  Value: 69        Total Cholesterol/HDL:CHD Risk Coronary Heart Disease Risk Table                     Men   Women  1/2 Average Risk   3.4   3.3  Average Risk       5.0   4.4  2 X Average Risk   9.6   7.1  3 X Average Risk  23.4  11.0        Use the calculated Patient Ratio above and the CHD Risk Table to determine the patient's CHD Risk.        ATP III CLASSIFICATION (LDL):  <100     mg/dL   Optimal  045-409  mg/dL   Near or Above                    Optimal  130-159  mg/dL   Borderline  811-914  mg/dL   High  >782     mg/dL   Very High 95/01/2129   Lab Results  Component Value Date   TRIG 205* 05/28/2010   Lab Results  Component Value Date   CHOLHDL 4.3 05/28/2010   No results found for this basename: LDLDIRECT        Return in about 4 months (around 05/10/2011) for F/u DM, hyperlip, HTN.

## 2011-01-07 NOTE — Assessment & Plan Note (Signed)
PSA done today (he recalls his last PSA being done over 1 yr ago.  His father had prostate cancer).

## 2011-01-07 NOTE — H&P (Signed)
NAME:  Henry Perry, Henry Perry NO.:  [id number]   MEDICAL RECORD NO.:  [id number]          PATIENT TYPE:  INP   LOCATION:  5507                         FACILITY:  [facility]   PHYSICIAN:  Altha Harm, MDDATE OF BIRTH:  July 19, 1961   DATE OF ADMISSION:  01/24/2008  DATE OF DISCHARGE:                              HISTORY & PHYSICAL   CHIEF COMPLAINT:  Abdominal pain.   HISTORY OF PRESENT ILLNESS:  This is a 50 year old gentleman who  presented to the emergency room with complaints of constipation x9 days.  The patient states that he started having constipation approximately 9  days ago.  Within the last few days, he has started having abdominal  pain in the left upper and lower quadrant.  The patient states that he  took several GI preps with no results in terms of bowel movements.  The  patient also offers that he started eating pumpkin seeds approximately 9  days ago as in addition to his health regimen.  He has had no fever or  chills.  He has had no nausea or vomiting.   PAST MEDICAL HISTORY:  1. Non-ST elevated MI.  2. Status post PTCA.  3. Hypertension.  4. Obesity.  5. Diet-controlled diabetes type 2.  6. Hyperlipidemia.  7. Status post cholecystectomy.  8. Hemorrhoids.   SOCIAL HISTORY:  The patient owns a Designer, television/film set.  He denies any  tobacco, alcohol or drug use.   CURRENT MEDICATIONS:  1. Aspirin enteric-coated 81 mg p.o. daily.  2. Coreg 25 mg p.o. daily.  3. Zocor 80 mg p.o. daily.  4. Lisinopril 20 mg p.o. daily.  5. Plavix 75 mg p.o. daily.  6. Nitroglycerin sublingual p.r.n.   ALLERGIES:  NO KNOWN DRUG ALLERGIES.   PRIMARY PHYSICIAN:  Gentry Fitz, goes to the [place] in [place].   REVIEW OF SYSTEMS:  Fourteen systems were reviewed and all systems were  negative except as noted in the HPI.   STUDIES DONE IN THE EMERGENCY ROOM SHOW:  Hemogram shows a white blood  cell count of 9.4, hemoglobin of 14, hematocrit of 40.4, platelet  count  228.  He has got a sodium of 141, potassium 3.8, chloride 104, bicarb  28, BUN 10, creatinine 1.0, bilirubin mildly elevated at 1.3.  Alk phos  of 127, AST 41.  Urinalysis is negative for any elements consistent with  a urinary tract infection.  CT of the abdomen shows acute diverticulitis  and a collection consistent with phlegmon.   PHYSICAL EXAMINATION:  GENERAL:  The patient is resting comfortably in  bed with no acute distress.  VITAL SIGNS:  Temperature 99.4, blood pressure 128/74, heart rate 75,  respiratory rate 16, O2 sat 97% on room air.  HEENT:  He is normocephalic, atraumatic.  Pupils are equal, round and  reactive to light and accommodation.  Extraocular movements are intact.  Oropharynx is moist.  No exudate, erythema or lesions noted.  NECK:  Trachea is midline.  No masses, no thyromegaly, no JVD, no  carotid bruits.  RESPIRATORY:  The patient has normal respiratory effort, equal excursion  bilaterally.  No wheezing or rhonchi noted.  No increased vocal  fremitus.  CARDIOVASCULAR:  He has got a normal S1-S2.  No murmurs, rubs or gallops  are noted.  PMI is nondisplaced.  No heaves or thrills on palpation.  Good upstroke with peripheral pulses.  ABDOMEN:  Abdomen is obese, soft.  There is tenderness in the left lower  and left upper quadrants.  No masses or hepatosplenomegaly noted.  LYMPH NODES:  There is no cervical, axillary or inguinal lymph nodes  noted.  NEUROLOGICAL:  The patient has no focal neurological deficits.  Cranial  nerves II-XII are grossly intact.  DTRs are 2+ bilaterally upper and  lower extremities.  Sensation is intact to light touch and  proprioception.  MUSCULOSKELETAL:  He has no warmth, swelling or erythema around the  joints.  No CVA tenderness.  No spinal tenderness.  PSYCHIATRIC:  He is alert and oriented x3, good insight and cognition,  good recent and remote recall.   ASSESSMENT/PLAN:  This is a patient who presents with:  1.  Acute diverticulitis.  2. History of hypertension.  3. History of coronary artery disease, status post myocardial      infarction.  4. History of hyperlipidemia.  5. History of for a diet controlled diabetes type 2.   Now that the patient has been made n.p.o., he will be given IV hydration  and/or laxatives.  The patient will also be given IV Flagyl and Cipro.  Further treatment of the patient will depend on his initial response to  treatment.  The patient will be resumed on his usual medications.      Altha Harm, MD  Electronically Signed     MAM/MEDQ  D:  01/24/2008  T:  01/24/2008  Job:  [id number] [id number]

## 2011-01-07 NOTE — Assessment & Plan Note (Signed)
Pt. Has started exercise regimen and diet and has a goal wt of 275 lbs.

## 2011-01-07 NOTE — Assessment & Plan Note (Signed)
[facility]                            CARDIOLOGY OFFICE NOTE   Henry Perry                        MRN:          [id number]  DATE:06/07/2008                            DOB:          [birth year]    Henry Perry is a pleasant 50 year old male previously followed by Dr.  Diona Browner.  His cardiac history dates back to November 2007.  He  underwent cardiac catheterization at that time secondary to a myocardial  infarction.  He was found to have an ejection fraction of 65%.  He had a  proximal 95% stenosis in his right coronary artery and the distal vessel  was occluded.  He subsequently underwent PCI of his right coronary  artery using 3 drug-eluting stents, 2 of which were overlapping.  He had  nonobstructive disease in his LAD with a 50% mid and a 40% lesion  further downstream.  He had a 50% second diagonal as well.  His last  Myoview was performed on June 24, 2007.  At that time, his ejection  fraction was 61%.  There was soft tissue attenuation but no ischemia.  Note in reviewing his records, he was admitted to [facility] in  June of this year secondary to an episode of diverticulitis.  A CAT scan  at that time apparently showed a lesion in his liver that was exophytic  and followup was recommended.  Since he was last seen, he is doing well  from a symptomatic standpoint.  There is no dyspnea, chest pain,  palpitations, or syncope.  There is no pedal edema.   His medications include:  1. Coreg 25 mg p.o. b.i.d.  2. Plavix 75 mg p.o. daily.  3. Lisinopril 20 mg p.o. daily.  4. Aspirin 81 mg p.o. daily.  5. Zocor 80 mg p.o. daily.  6. Metformin 500 mg p.o. daily.   His physical exam today shows a blood pressure of 117/72 and his pulse  is 79.  He weighs 226 pounds.  His HEENT is normal.  His neck is supple  with no bruits.  His chest is clear.  His cardiovascular exam is regular  rate.  His abdominal exam shows no tenderness.   Extremities show no  edema.   Electrocardiogram shows a sinus rhythm at a rate of 76.  There are minor  nonspecific ST changes.   DIAGNOSES:  1. Coronary artery disease - Mr. Beaumier is doing well from a      symptomatic standpoint with no chest pain or shortness of breath.      His Myoview in October showed no ischemia.  We will continue with      medical therapy to include his aspirin, Plavix, angiotensin-      converting enzyme inhibitor, beta-blocker, and statin.  2. Diabetes mellitus - Management per his primary care physician.  3. Hyperlipidemia - He will continue on statin.  He states all of his      blood work including his lipids, liver, and renal function are      being followed at [facility].  I will ask that this  is to be      forwarded to [place] for our records.  4. History of liver mass - This has not been followed up.  We will      arrange for him to seen by one of our gastroenterologist to see      what workup is needed.  5. Hypertension - His blood pressure is adequately controlled on his      present medications.  6. History of gastroesophageal reflux disease.   We will see him back in 12 months.  I have discussed the importance of  diet and exercise.  He does not smoke.     Madolyn Frieze Jens Som, MD, [facility]  Electronically Signed    BSC/MedQ  DD: 06/07/2008  DT: 06/07/2008  Job #: [id number]

## 2011-01-07 NOTE — Assessment & Plan Note (Signed)
Stable. HbA1c today. Patient wants to focus on attempt at wt loss/diet/exercise as his only mode of treatment for this, but we'll see what A1c is first. He has had DM nutrition classes in the distant past and seems well motivated to follow this and to exercise regularly (has already started).

## 2011-01-07 NOTE — Assessment & Plan Note (Signed)
[facility]                            CARDIOLOGY OFFICE NOTE   ZACKARI, RUANE                        MRN:          [id number]  DATE:01/10/2008                            DOB:          30-Jun-1961    PRIMARY CARE PHYSICIAN:  Dr. Elease Hashimoto at [facility].   REASON FOR VISIT:  Cardiology follow-up.   HISTORY OF PRESENT ILLNESS:  Mr. Akhtar is doing well.  He has had no  exertional angina or dyspnea.  We did arrange a follow-up adenosine  Myoview back in October 2008 which was overall low risk without frank  ischemia and ejection fraction of 61%.  Electrocardiogram today is  normal showing sinus rhythm at 62 beats per minute.  He is interested in  a basic exercise program at the gym, and we talked about reasonable  aerobic exercise goals.  He is going to try to lose approximately 20  pounds.  Otherwise, he has been tolerating his medications well.   ALLERGIES:  No known drug allergies.   MEDICATIONS:  1. Coreg 25 mg p.o. b.i.d.  2. Plavix 75 mg p.o. daily.  3. Lisinopril 200 mg p.o. daily.  4. Enteric-coated aspirin 81 mg p.o. daily.  5. Simvastatin 80 mg p.o. q.h.s.  6. Sublingual nitroglycerin 0.4 mg p.r.n.   HISTORY OF PRESENT ILLNESS:  No claudication.  No palpitations or  syncope.   PHYSICAL EXAMINATION:  VITAL SIGNS:  Blood pressure 142/92, heart rate  67, weight 229 pounds.  GENERAL:  The patient is overweight in no acute distress.  HEENT:  Conjunctivae was normal, oropharynx clear.  NECK:  Supple.  No elevated jugular venous pressure, no loud bruits.  No  thyromegaly.  LUNGS:  Clear without labored breathing.  CARDIAC:  Reveals regular rate and rhythm.  No loud murmur or gallop.  EXTREMITIES:  Show no pitting edema.   IMPRESSION/RECOMMENDATIONS:  1. Coronary artery disease status post inferior wall myocardial      infarction in November 2007 treated with overlapping drug-eluting  stents in the right coronary artery extending into the posterior      descending branch.  Recent ischemic evaluation was reassuring.  Our      plan will be to continue medical therapy.  I have encouraged him to      begin a regular aerobic exercise regimen with a goal of weight      loss.  Will plan to see him back in the next six months.  2. Hyperlipidemia on statin therapy as well as hypertension, blood      pressure elevated today.      He continues with regular follow-up through the [facility].     Jonelle Sidle, MD  Electronically Signed    SGM/MedQ  DD: 01/10/2008  DT: 01/10/2008  Job #: [id number]   cc:   [facility]

## 2011-01-07 NOTE — Assessment & Plan Note (Signed)
Continue statin, continue diet/exercise/wt loss. Check NMR lipoprofile today (he is not fasting). Lab Results  Component Value Date   CHOL  Value: 143        ATP III CLASSIFICATION:  <200     mg/dL   Desirable  161-096  mg/dL   Borderline High  >=045    mg/dL   High        40/04/8118   Lab Results  Component Value Date   HDL 33* 05/28/2010   Lab Results  Component Value Date   LDLCALC  Value: 69        Total Cholesterol/HDL:CHD Risk Coronary Heart Disease Risk Table                     Men   Women  1/2 Average Risk   3.4   3.3  Average Risk       5.0   4.4  2 X Average Risk   9.6   7.1  3 X Average Risk  23.4   11.0        Use the calculated Patient Ratio above and the CHD Risk Table to determine the patient's CHD Risk.        ATP III CLASSIFICATION (LDL):  <100     mg/dL   Optimal  147-829  mg/dL   Near or Above                    Optimal  130-159  mg/dL   Borderline  562-130  mg/dL   High  >865     mg/dL   Very High 78/11/6960   Lab Results  Component Value Date   TRIG 205* 05/28/2010   Lab Results  Component Value Date   CHOLHDL 4.3 05/28/2010   No results found for this basename: LDLDIRECT

## 2011-01-07 NOTE — Assessment & Plan Note (Signed)
[facility]                            CARDIOLOGY OFFICE NOTE   KYEL, PURK                        MRN:          [id number]  DATE:05/24/2007                            DOB:          02-11-1961    PRIMARY CARE PHYSICIAN:  Dr. Elease Hashimoto, at the [facility].   REASON FOR VISIT:  Cardiac followup.   HISTORY OF PRESENT ILLNESS:  Mr. Blubaugh is a pleasant 50 year old male  previously followed by Dr. Samule Ohm and establishing with me at this  point.  He has a history of previous inferior wall myocardial infarction  in November of 2007 treated with overlapping drug-eluting stents in the  right coronary artery extending in the proximal posterior descending  branch.  He did well with this and has normal left ventricular systolic  function with mild inferior hypokinesis.  He is doing well without any  active anginal symptoms or limiting dyspnea on exertion.  He states that  his insurance [employer] no longer covers Plavix after 6 months, although  Dr. Melinda Crutch notes indicate a recommendation for indefinite aspirin and  Plavix given the patient's overlapping drug-eluting stents in the  setting of an acute infarct.  I discussed this with him today and he is  in agreement to continue Plavix.  His cholesterol has been followed  through the [facility] system.  Electrocardiogram  today is normal, showing sinus rhythm at 74 bpm.   ALLERGIES:  No known drug allergies.   PRESENT MEDICATIONS:  1. Coreg 25 mg p.o. b.i.d.  2. Aspirin 81 mg p.o. daily.  3. Plavix 75 mg p.o. daily.  4. Simvastatin 80 mg p.o. q.h.s.  5. Lisinopril 20 mg p.o. daily.  6. Nitroglycerin 0.4 mg sublingual p.r.n.   REVIEW OF SYSTEMS:  As described in the History of Present Illness.   PHYSICAL EXAMINATION:  Blood pressure today is 130/88, heart rate is 73,  weight is 229 pounds.  This is an overweight male in no acute distress.  EXAMINATION OF THE NECK:  No elevated jugular venous pressure, no loud  bruits.  LUNGS:  Clear without labored breathing at rest.  CARDIAC EXAM:  A regular rate and rhythm, no loud murmur or S3 gallop.  EXTREMITIES:  No significant pitting edema.   IMPRESSION:  Coronary disease status post previous inferior wall  myocardial infarction as outlined above.  The patient is doing well  symptomatically.   PLAN:  1. Continue present regimen including Plavix long term.  I provided a      prescription today.  I will      plan to see him back over the next 6 months for symptom review.  2. Hyperlipidemia, followed by primary care Brandalynn Ofallon.  Goal LDL should      be less than 70.     Jonelle Sidle, MD  Electronically Signed    SGM/MedQ  DD: 05/24/2007  DT: 05/24/2007  Job #: [id number]

## 2011-01-08 NOTE — Telephone Encounter (Signed)
I got a phone call from the [place] they said there are over 100 pages of records, do you want all of those pages or do you have specific tests, labs, etc that you would like for them to send? Thanks!

## 2011-01-08 NOTE — Telephone Encounter (Signed)
Can you ask them to just send the records from the last 1 year?

## 2011-01-09 LAB — NMR LIPOPROFILE WITHOUT LIPIDS

## 2011-01-10 NOTE — Cardiovascular Report (Signed)
NAME:  Henry Perry, Henry Perry NO.:  [id number]   MEDICAL RECORD NO.:  [id number]          PATIENT TYPE:  OIB   LOCATION:  2807                         FACILITY:  [facility]   PHYSICIAN:  Salvadore Farber, MD  DATE OF BIRTH:  [birth year]   DATE OF PROCEDURE:  07/06/2006  DATE OF DISCHARGE:                              CARDIAC CATHETERIZATION   PROCEDURE:  Left heart catheterization, left ventriculography, coronary  angiography, placement of two overlapping drug-eluting stents in the  proximal right coronary artery, placement of a single drug-eluting stent in  the distal right coronary artery extending into the proximal posterior  descending artery, StarClose closure of the right common femoral arteriotomy  site.   INDICATION:  Mr. Ricketts is a 50 year old gentleman with hypertension.  He  was also recently diagnosed with hypercholesterolemia and diabetes mellitus,  for which he has not yet begun therapy.  He has no prior history of  cardiovascular disease.  At 6 o'clock this morning, he developed substernal  chest discomfort.  This waxed and waned for several hours and then became  fixed.  He presented to [facility] where electrocardiogram demonstrated an  inferior myocardial infarction.  He was treated with aspirin, morphine and  beta blockade.  He was transferred emergently to the cardiac catheterization  lab via EMS.  He arrived with ongoing 8/10 substernal chest discomfort and  inferior ST elevation on the monitor.  I recommended catheterization with an  eye to percutaneous revascularization as the most effective and safe mode of  revascularization.  He agreed to proceed emergently.  He also consented to  participate in the Mescal study, studying an acute platelet inhibitor.   PROCEDURAL TECHNIQUE:  Informed consent was obtained.  Under 1% lidocaine  local anesthesia, a 6-French sheath was placed in the right common femoral  artery using the modified Seldinger  technique.  Diagnostic angiography was  performed using JL-4 and JR-4 catheters.  This demonstrated a hazy 95%  stenosis of the proximal right coronary artery and occlusion of the distal  right coronary artery before the origin of the PDA.  We then proceeded to  percutaneous revascularization.   Anticoagulation had been initiated with bivalirudin upon his arrival.  ACT  was confirmed to be greater than 225 seconds.  The Alla Feeling study drug was  administered.  A 6-French JR-4 guide was advanced over wire and engaged in  the ostium of the right coronary.  A Prowater wire was advanced into the  distal PDA without difficulty.  The region of occlusion of the distal vessel  was dilated using a 2.5 x 15 mm Fire Star balloon at 8 atmospheres.  This  established TIMI III flow distally.  Within 10 seconds of reperfusion, his  ST segments normalized on the monitor.  I then pulled this balloon back and  used it to predilate the proximal lesion.  In positioning the balloon, we  had to unseat the guide back into the aorta.  With attempts to recede it,  the guidant wire became dislodged.  I then went back in with a JR-4 guide  with side holes.  I advanced the Prowater wire into the distal PDA again  without difficulty.  I then proceeded to stenting of what had been the  occlusion in the distal right coronary artery using a 2.75 x 24 mm Taxus  deployed at 16 atmospheres.  I then stented the ostial lesion using a 3.5 x  32 mm Taxus deployed at 16 atmospheres.  This left uncovered an area of  severe disease with ulceration and probable dissection just at the distal  stent margin.  I stented this using an overlapping 3.5 x 12 mm Taxus  deployed at 18 atmospheres.  I then postdilated the distal stent using a 3.0  x 20 mm Dura Star stent at 18 atmospheres for two inflations.  I then  postdilated the entirety of the proximal stented segment using a 4.0 x 25 mm  Dura Star for multiple inflations each at 18  atmospheres.  Final angiography  demonstrated no residual stenosis, no dissection, and TIMI III flow to the  distal vasculature.   Left heart catheterization and ventriculography were then performed using a  pigtail catheter.   Finally, the arteriotomy was closed using a StarClose device.  Complete  hemostasis was obtained.  He was transferred to the holding room in stable  condition having tolerated the procedure well.  His pain had resolved along  with his ST elevations upon establishment of flow distally.   COMPLICATIONS:  None.   FINDINGS:  1. LV:  110/9/16.  EF 65% with mild inferior hypokinesis (measured after      reperfusion).  2. No aortic stenosis or mitral regurgitation.  3. Left main:  Angiographically normal.  4. LAD:  A moderate-sized vessel giving rise to a small first and moderate-      sized second diagonal branch.  The mid LAD has a 50% stenosis just      after the origin of the second diagonal.  There was then a 40% stenosis      approximately 10 mm downstream.  The second diagonal has an ostial 50%      stenosis.  5. Circumflex:  A moderate-sized vessel giving rise to two marginals.  The      first marginal has an ostial 40% stenosis.  The distal AV groove      circumflex has a 30% stenosis.  6. RCA:  A moderate-sized dominant vessel.  The proximal vessel was      diffusely diseased with up to 95% stenosis.  The distal vessel was      occluded.  Both were treated to no residual stenosis.  In the      midvessel, there is a 40% untreated stenosis.   IMPRESSION/PLAN:  Successful percutaneous revascularization of the occluded  right coronary artery using three drug-eluting stents (two overlapping).  Will plan medical therapy for the remainder of his disease.  Will initiate a  high-potency statin today.  Due to the overlapping drug-eluting stents, he  should be maintained on both aspirin and Plavix indefinitely.  We will add a beta blocker and continue an ACE  inhibitor.      Salvadore Farber, MD  Electronically Signed     WED/MEDQ  D:  07/06/2006  T:  07/06/2006  Job:  [id number]   cc:   Marjory Lies, M.D.

## 2011-01-10 NOTE — Assessment & Plan Note (Signed)
[facility]                            CARDIOLOGY OFFICE NOTE   Henry Perry, Henry Perry                        MRN:          [id number]  DATE:10/15/2006                            DOB:          07/01/1961    October 15, 2006   PRIMARY CARE PHYSICIAN:  Dr. Elease Hashimoto, [facility].   HISTORY OF PRESENT ILLNESS:  Henry Perry is a 50 year old gentleman who  suffered inferior myocardial infarction in November 2007.  I placed two  overlapping drug eluting stents in his right coronary artery extending  into the proximal PDA.  His post infarct course has been uncomplicated.  Ejection fraction was initially 65% with mild inferior hypokinesis as  assessed after reperfusion.  Echocardiogram  performed September 18, 2006,  shows normal left ventricular systolic function without regional wall  motion abnormality.   He has continued to do well.  He is participating in cardiac  rehabilitation.  He is having no angina or exertional dyspnea,  palpitations, syncope, presyncope, or claudication.   CURRENT MEDICATIONS:  1. Coreg 25 mg twice daily.  2. Aspirin 81 mg daily.  3. Plavix 75 mg daily.  4. Lisinopril 20 mg daily.  5. Simvastatin 80 mg daily.   PHYSICAL EXAMINATION:  GENERAL APPEARANCE:  He is generally well-  appearing in no distress.  VITAL SIGNS:  Heart rate 64, blood pressure 110/80, weight 216 pounds.  NECK:  He has no jugular venous distension or thyromegaly.  LUNGS:  Clear to auscultation.  CARDIOVASCULAR:  He has a nondisplaced point of maximal cardiac impulse.  There is a regular rate and rhythm without murmurs, rubs, or gallops.  ABDOMEN:  Obese, soft, nondistended and nontender.  There is no  hepatosplenomegaly.  Bowel sounds are normal.  EXTREMITIES:  Warm without clubbing, cyanosis, edema or ulceration.   IMPRESSION/RECOMMENDATIONS:  1. Coronary disease status post inferior myocardial infarction.      Ejection fraction is now  normal without regional wall motion      abnormality.  Continue current medical therapy including aspirin,      Plavix, ACE inhibitor and beta-blocker.  Due to the overlapping      drug eluting stents, would continue the aspirin and Plavix      indefinitely.  2. Hypercholesterolemia:  Continue Simvastatin 80 mg nightly.  He      tells me his LDL was recently 63, as checked at the [place].   He will follow up in six months' time.     Henry Farber, MD  Electronically Signed    WED/MedQ  DD: 10/15/2006  DT: 10/15/2006  Job #: [id number]   cc:   [facility] Dr. Elease Hashimoto

## 2011-01-10 NOTE — H&P (Signed)
NAME:  Henry Perry, Henry Perry NO.:  [id number]   MEDICAL RECORD NO.:  [id number]          PATIENT TYPE:  OIB   LOCATION:  2899                         FACILITY:  [facility]   PHYSICIAN:  Salvadore Farber, MD  DATE OF BIRTH:  1961-08-12   DATE OF ADMISSION:  07/06/2006  DATE OF DISCHARGE:                                HISTORY & PHYSICAL   DATE OF ADMISSION:  07/06/2006.   PRIMARY CARE PHYSICIAN:  Dr. Doristine Counter at [facility].  Primary cardiologist is new and will be Dr. Samule Ohm.   CHIEF COMPLAINT:  Chest pain.   HISTORY OF PRESENT ILLNESS:  Mr. Cerone is a 50 year old male with no  previous history of coronary artery disease.  He woke this a.m. with  substernal chest pain described as a pressure.  It was intermittent at first  and he treated it with Mylanta.  It became worse and constant and reached an  8/10.  He then went to [facility] where an EKG showed inferior ST elevation.  EMS was called, and he was transported urgently to the emergency room and  taken directly to the cath lab.   Mr. Datta has never had this kind of pain before.  Between [facility] and  EMS, he received a total of 3 sublingual nitroglycerin as well as 4 mg of  morphine and 25 mg of metoprolol.  His pain is currently decreased to a 4 or  5/10 but is not relieved.  His EKG still shows inferior ST elevation.   PAST MEDICAL HISTORY:  He has a history of hyperlipidemia and hypertension  and a remote history of tobacco use as well as obesity.  There is no family  history of premature coronary artery disease, and he is not a known  diabetic.  He has had a rectal bleed in the past that was secondary to  hemorrhoids.  He denies any other pertinent medical history.   SURGICAL HISTORY:  He is status post colonoscopy and cholecystectomy.   SOCIAL HISTORY:  He lives in [place] with his wife.  He is the owner of a  real estate company.  He quit tobacco 3-4 years ago with approximately a  20-  pack-year history.  He denies any history of alcohol or drug abuse.   ALLERGIES:  No known drug allergies.   MEDICATIONS:  He takes Benicar 20 mg a day and a cholesterol medication  which he obtains at the [facility].   FAMILY HISTORY:  Neither one of his parents has a history of premature  coronary artery disease, and there is no coronary artery disease in any of  his siblings.   REVIEW OF SYSTEMS:  He denies any hematemesis, hemoptysis or melena.  He has  had no recent illnesses.  The chest pain is associated with some shortness  of breath, but there is no nausea or diaphoresis noted.  He denies any  problems with urination, and he denies any history of cough or wheezing or  upper respiratory problems.  He has not had any recent rectal bleeding, and  he denies indigestion,  heartburn or other reflux symptoms.  Review of  systems is otherwise negative.   PHYSICAL EXAMINATION:  VITAL SIGNS:  He is afebrile.  Blood pressure is  130/73 with a pulse of 62, respiratory rate 16, O2 saturation 100% on 2  liters.  GENERAL:  He is a well-developed African-American male who is obese with a  body mass index of 35.5.  HEENT:  His head is normocephalic and atraumatic with sclera clear.  Nares  without discharge.  NECK:  There is no lymphadenopathy, thyromegaly, bruit or JVD noted.  CV:  His heart is regular in rate and rhythm with an S1-S2, and no significant  murmur, rub or gallop is noted.  LUNGS:  Are essentially clear to auscultation bilaterally.  SKIN:  No rashes or lesions are noted.  ABDOMEN:  Soft and nontender with active bowel sounds.  EXTREMITIES:  There is no cyanosis, clubbing or edema.  2+ pulses are noted  in all 4 extremities, and no femoral bruits are appreciated.  MUSCULOSKELETAL:  There is no joint deformity or effusion.  NEUROLOGIC:  He is alert and oriented.  Cranial nerves II-XII grossly  intact.   ELECTROCARDIOGRAM:  EKG performed by EMS shows sinus rhythm,  rate 75 beats  per minute with 2 to 3-mm inferior ST elevation.  An old EKG dated 2005 was  sent from Dr. Mellody Life office and shows normal sinus rhythm with some  inferior T-wave flattening but no acute changes.   LABORATORIES AND X-RAYS:  Laboratory values and chest x-ray are pending at  the time of dictation.   IMPRESSION:  1. Acute inferior ST-segment elevation myocardial infarction.  He is being      catheterized urgently with further evaluation and treatment depending      on the results of the heart catheterization.  He will be empirically      changed to Lipitor 80 mg a day, and we will check a fasting lipid      profile as well as a hemoglobin A1c.  We will continue the Benicar 20      mg a day with further medications including a beta blocker to be added      as tolerated for blood pressure control. Dr. Randa Evens saw the      patient and determined the plan of care.      Theodore Demark, PA-C      Salvadore Farber, MD  Electronically Signed    RB/MEDQ  D:  07/06/2006  T:  07/06/2006  Job:  [id number]   cc:   Marjory Lies, M.D.

## 2011-01-10 NOTE — Discharge Summary (Signed)
NAME:  Henry Perry, Henry Perry NO.:  [id number]   MEDICAL RECORD NO.:  [id number]          PATIENT TYPE:  INP   LOCATION:  3705                         FACILITY:  [facility]   PHYSICIAN:  Gerrit Friends. Dietrich Pates, MD, FACCDATE OF BIRTH:  [birth year]   DATE OF ADMISSION:  07/10/2006  DATE OF DISCHARGE:  07/12/2006                                 DISCHARGE SUMMARY   REASON FOR ADMISSION:  Chest discomfort.   DISCHARGE DIAGNOSES:  1. Chest discomfort, suspect gastrointestinal component.  2. Coronary disease.      a.     Status post inferior ST-elevation myocardial infarction,       July 06, 2006.      b.     Treated with drug-eluting stents x3 to the right coronary       artery.      c.     Preserved left ventricular function.  3. Hypertension.  4. Hyperlipidemia.  5. Diabetes mellitus.  6. Obesity  7. Status post cholecystectomy.  8. History of hemorrhoids.  9. Probable gastroesophageal reflux disease,  proton pump inhibitor      initiated this admission.   HISTORY:  Henry Perry is a 50 year old  male patient who was recently  discharged after suffering inferior ST-elevation myocardial infarction  treated with drug-eluting stent x3 to the RCA.  On the date of admission, he  had chest tightness without radiation while wiping counters off after  dinner.  He came to the emergency room.  He was admitted for further  evaluation and treatment.   HOSPITAL COURSE:  The patient's cardiac markers were checked.  It was noted  that his AST was elevated.  His troponins were 0.8 in the ER.  The serial  markers were different.  These were followed over the next day.  His  troponin I initially was 1.51 (serum); #2, 0.98; #3, 0.87; #4, 0.80. CK-MBs  remained negative at 2.1, 1.6, 1.5, 1.3.  The patient noted that his  symptoms were relieved by eructation.  Proton pump inhibitor was added.  Dosage was increased.  Dr. Dietrich Pates saw the patient on the morning of  November 18.  The patient  noted some heartburn earlier in the day which was  similar to his presenting symptoms.  It was felt that there was no evidence  for acute coronary syndrome.  The patient was discharged home in stable  condition.  His Coreg will be increased, and he will remain on proton pump  inhibitor therapy.  He will follow up with Henry Perry as planned.   LABORATORY DATA:  White count 6000, hemoglobin 13.7, hematocrit 40.5,  platelet count 276,000.  INR 1.2.  Sodium 139, potassium 4.1, glucose 99,  BUN 8, creatinine 0.9, total bilirubin 1.0, alkaline phosphatase 89, AST 36,  ALT 60, total protein 6.8, albumin 3.6, calcium 9.1.  Cardiac markers as  noted above. Hemoglobin A1c 7.2   Chest x-Henry Perry on admission: No acute abnormalities.   DISCHARGE MEDICATIONS:  1. Coreg 25 mg b.i.d., increased  2. Protonix 40 mg twice daily.  3. Aspirin 325 mg daily.  4. Plavix 75  mg daily.  5. Lisinopril 20 mg daily.  6. Lipitor 80 mg nightly.  7. Nitroglycerin p.r.n. chest pain.  8. Metformin as taken previously.   DIET:  Low-fat, low-sodium, diabetic diet.   ACTIVITIES:  He is to increase his activity slowly with no heavy lifting or  exertional activity for 1 week.   FOLLOW UP:  1. He is to follow up is with Henry Perry on December 10 at 2:45 p.m.  2. He is to follow up with his primary care physician at [facility]      as well in the next few weeks.   Total physician and PA time: Greater than 30 minutes.      Tereso Newcomer, PA-C      Gerrit Friends. Dietrich Pates, MD, [facility]  Electronically Signed    SW/MEDQ  D:  07/12/2006  T:  07/12/2006  Job:  [id number]

## 2011-01-10 NOTE — Discharge Summary (Signed)
NAME:  KOLE, HILYARD NO.:  [id number]   MEDICAL RECORD NO.:  [id number]          PATIENT TYPE:  INP   LOCATION:  3741                         FACILITY:  [facility]   PHYSICIAN:  Salvadore Farber, MD  DATE OF BIRTH:  Sep 24, 1960   DATE OF ADMISSION:  07/06/2006  DATE OF DISCHARGE:  07/08/2006                                 DISCHARGE SUMMARY   PROCEDURES:  1. Cardiac catheterization.  2. Coronary arteriogram.  3. Left ventriculogram.  4. PTCA and drug eluting stent proximal RCA as well as drug eluting stent      to the distal RCA/PDA and StarClose of the right femoral artery.   TIME AT DISCHARGE:  34 minutes.   PRIMARY DIAGNOSIS:  Acute inferior ST elevation myocardial infarction.   SECONDARY DIAGNOSES:  1. Hypertension.  2. Hyperlipidemia with a total cholesterol of 149, triglycerides 129, HDL      31, and LDL 92.  3. Remote history of tobacco use.  4. Obesity.  5. Untreated diabetes.  6. History of hemorrhoids.  7. Status post colonoscopy and cholecystectomy.   HOSPITAL COURSE:  Mr. Mancinas is a 50 year old male with a previous history  of coronary artery disease.  He woke with substernal chest pain on the day  of admission at 6 a.m., but it was intermittent.  The symptoms became  constant at approximately 11 a.m. while he was at work.  Mylanta was no  help, so he went to [facility], who saw ST elevation on his ECG and  transferred him urgently by EMS.  He was taken directly to the cath lab.   The cardiac catheterization showed a totaled RCA distally and a 95% RCA  proximal.  These lesions were treated with a total of three drug-eluting  stents with TIMI III flow.  He has residual nonobstructive disease in the  LAD, second diagonal, OM, and circumflex for which medical therapy is  recommended.   A beta blocker was added to his medication regimen, and his statin was  changed to Lipitor 80 mg a day.  His blood pressure was under good control.  A  hemoglobin A1c was checked because his blood sugar was elevated, and it  was 7.1.  He was given information on a heart healthy diet, and Glucophage  was added to his medication regimen.   Mr. Vandervliet was seen by Cardiac Rehab and given information on cardiac risk  factor reduction, activity restrictions, use of sublingual nitroglycerin,  and other information.  He ambulated without chest pain or shortness of  breath.   By July 08, 2006, Mr. Belvin's blood pressure was well controlled.  His  cath site was without hematoma or bruit.  He had initially been hypokalemic,  but this was supplemented.  He has been on a diuretic prior to admission,  and this was discontinued in favor of beta blocker.  On July 08, 2006,  Mr. Tippin was evaluated by Dr. Samule Ohm and considered stable for discharge  with outpatient followup arranged.   LABORATORY VALUES:  Sodium 139, potassium 3.9, chloride 104, CO2 27, BUN 9,  creatinine  0.8, glucose 158, AST minimally elevated at 107, and ALT  minimally elevated at 71.  Peak CK-MB 860/96 with a peak troponin of 17.37.  TSH 0.498.   DISCHARGE INSTRUCTIONS:  His activity level is to be increased gradually.  He is to stick to a low-fat diabetic diet.  He is to call our office for  problems with the cath site.  He is to followup with Dr. Samule Ohm on  [place] at 2:45 and see his primary care physician at the [facility] in 2-3 weeks.   DISCHARGE MEDICATIONS:  1. Plavix 75 mg daily.  2. Nitroglycerin sublingual p.r.n.  3. Metformin 500 mg daily before breakfast.  4. Coated aspirin 325 mg daily.  5. Lipitor 80 mg daily.  6. Lisinopril 20 daily.  7. Coreg 12.5 mg 1 tab b.i.d.  8. He is not to take Zocor or lisinopril/HCTZ.      Theodore Demark, PA-C      Salvadore Farber, MD  Electronically Signed    RB/MEDQ  D:  07/08/2006  T:  07/08/2006  Job:  [id number]   cc:   Dr. Kathrynn Running

## 2011-01-10 NOTE — H&P (Signed)
NAME:  TABIUS, ROOD NO.:  [id number]   MEDICAL RECORD NO.:  [id number]          PATIENT TYPE:  INP   LOCATION:  3705                         FACILITY:  [facility]   PHYSICIAN:  Unice Cobble, MD     DATE OF BIRTH:  [birth year]   DATE OF ADMISSION:  07/10/2006  DATE OF DISCHARGE:                              HISTORY & PHYSICAL   CHIEF COMPLAINT:  Chest pain.   HISTORY OF PRESENT ILLNESS:  This is a 50 year old African American male  who was discharged on July 08, 2006 after stenting of his proximal  and distal RCA/PDA with 3 drug-eluting stents who comes in today  complaining of chest pain.  He was wiping his counters off after dinner  and had chest tightness without radiation with shortness of breath.  Mild diaphoresis.  No presyncope or palpitations.  No PND, orthopnea,  edema.  He took 1 sublingual nitroglycerin, which eased his pain off  somewhat and, not knowing what to do, called the on-call M.D.  The on-  call M.D. told him to take more nitroglycerin and, if the pain did not  go away, to come to the emergency room, which he did.  He tells me that  this pain is much less than the NSTEMI he had a few days ago.  At that  time, he had pain and pressure, but this time there is only pressure.  He is taking his medications as prescribed, except for metformin, which  he is waiting for the [place] to fill.  He is having considerable anxiety  over his chest pain and new diagnosis of coronary disease.   PAST MEDICAL HISTORY:  1. NSTEMI status post PCI to the RCA/PDA with drug-eluting stents x3      on July 06, 2006.  2. Hypertension.  3. Obesity.  4. Diabetes with hemoglobin A1c of 7.1.  5. Hyperlipidemia.  6. Hemorrhoids.  7. Status post cholecystectomy.   ALLERGIES:  NO KNOWN DRUG ALLERGIES.   MEDICATIONS:  1. Lipitor 80 mg daily.  2. Plavix 75 mg daily.  3. Metformin 500 mg daily.  4. Aspirin 325 mg daily.  5. Lisinopril 20 mg daily.  6. Coreg 12.5 mg  b.i.d.   SOCIAL HISTORY:  He lives in [place] with his wife.  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  He has 20-pack-years of tobacco, but quit 3 to 4  years ago.  No alcohol or drugs.   FAMILY HISTORY:  There is not history of premature coronary artery  disease in his family.   REVIEW OF SYSTEMS:  A complete review of systems was done and found to  be otherwise negative except as mentioned in the HPI.   PHYSICAL EXAMINATION:  VITAL SIGNS:  Temperature 97.8, pulse 62,  respiratory rate 18, blood pressure 141/80, his O2 saturation is 97% on  room air.  GENERAL:  He is in no acute distress.  HEENT:  NCAT, PERRLA, EOMI, MMM.  NECK:  Supple without lymphadenopathy, thyromegaly, bruits, or jugular  venous distention.  CHEST:  Clear to auscultation bilaterally.  HEART:  Regular rate and rhythm without murmurs, gallops, or rubs.  PMI  is nondisplaced and S1 and S2 are clearly heard.  ABDOMEN:  Soft and nontender with good bowel sounds.  No evidence of  hepatosplenomegaly.  EXTREMITIES:  Warm and well perfused without clubbing, cyanosis, or  edema.  MUSCULOSKELETAL:  No joint deformities or effusions.  NEUROLOGIC:  He is grossly intact, alert and oriented x3 with cranial  nerves II-XII grossly intact.   Chest x-ray shows no acute cardiopulmonary disease.  EKG shows normal  sinus rhythm with a rate of 63 and T-wave inversions inferiorly with Q-  waves in 3.  This is no significant change from his prior EKG.   LABORATORY DATA:  White count 6.2, hemoglobin 14.6 with a platelet count  of 269,000.  His basic metabolic panel is unremarkable with a creatinine  of 1.1 and a glucose of 141.  His CK-MB is 1.5 with a troponin of 0.8.  Two days ago his troponin was 2.7.   ASSESSMENT/PLAN:  This is a 50 year old African American male with  diabetes mellitus, hypertension, hyperlipidemia with a recent non-ST  segment elevation myocardial infarction and percutaneous coronary  intervention to  his right coronary artery x3 who presents with chest  pain.  His symptoms are concerning for ichemia, if so is much less  severe than his non-ST segment elevation myocardial infarction  presentation.  There is a large anxiety component due to his age and  novelty of disease.  I will admit him tonight to rule out myocardial  infarction.  If his troponin or CK-MB begins to rise, or if his EKG  changes, then he will need a repeat catheterization.  As he is chest  pain free and on Plavix and aspirin, I will only start heparin if his  cardiac enzymes show rise.  We will reevaluate in the morning to see if  he may be discharged or will need to stay the weekend for a  catheterization on Monday.      Unice Cobble, MD  Electronically Signed     ACJ/MEDQ  D:  07/11/2006  T:  07/11/2006  Job:  [id number]

## 2011-01-10 NOTE — Assessment & Plan Note (Signed)
Henry Perry                            CARDIOLOGY OFFICE NOTE   Henry Perry, Henry Perry                        MRN:          [id number]  DATE:08/03/2006                            DOB:          07-Jan-1961    HISTORY OF PRESENT ILLNESS:  Henry Perry is a 50 year old gentleman who  suffered inferior myocardial infarction on July 06, 2006. I placed  two overlapping drug-eluting stents in the right coronary artery  extending into the proximal posterior descending artery.  His post  infarct course was uncomplicated.  Ejection fraction was 65% with mild  inferior hypokinesis as assessed after reperfusion.  He had mild,  nonobstructive coronary disease elsewhere.  He was rehospitalized with  atypical chest pain and ruled out for myocardial infarction shortly  after his initial hospitalization.  He has done well since without  recurrent discomfort.   CURRENT MEDICATIONS:  1. Coreg 25 mg twice per day.  2. Aspirin 81 mg per day.  3. Plavix 75 mg per day.  4. Lisinopril 10 mg per day.  5. Simvastatin 80 mg q.h.s.   REVIEW OF SYSTEMS:  GENERAL:  Denies fever, chills, night sweats.  RESPIRATORY:  Has chronic cough, productive of white sputum.  GI:  Occasional nausea and GERD symptoms.  CARDIAC:  Denies chest pain, PND,  orthopnea, edema.  It is otherwise negative in detail except as above.   PHYSICAL EXAMINATION:  GENERAL:  He is generally a well-appearing, in no  distress.  VITAL SIGNS:  Heart rate 54, blood pressure 128/78, weight of 218  pounds.  NECK:  No jugular venous distension.  No thyromegaly.  LUNGS:  Clear to auscultation.  CARDIOVASCULAR:  He has a nondisplaced point of maximal cardiac impulse.  There is a regular rate and rhythm without murmur, rub or gallop.  ABDOMEN:  Obese.  Soft, nondistended, nontender.  There is no  hepatosplenomegaly.  Bowel sounds are normal.  EXTREMITIES:  Warm without clubbing, cyanosis, edema, or ulcerations.   Electrocardiogram demonstrates sinus bradycardia with inferior Q wave.   IMPRESSION/RECOMMENDATIONS:  1. Inferior myocardial infarction.  Doing well one months out. Begin      cardiac rehab.  Continue aspirin, Plavix, beta blocker. Increase      ACE inhibitor 20 mg daily.  Due to the overlapping drug-eluting      stents, he should be continued on both aspirin and Plavix      indefinitely.  2. Hypercholesterolemia. Continue simvastatin 80 mg at night.   I will plan on seeing him back in eight weeks. Will check echo  immediately prior to that to assess left ventricular systolic function.     Henry Farber, MD  Electronically Signed    WED/MedQ  DD: 08/03/2006  DT: 08/04/2006  Job #: [id number]   cc:   Henry Perry at the [facility]

## 2011-01-15 ENCOUNTER — Telehealth: Payer: Self-pay | Admitting: Family Medicine

## 2011-01-15 NOTE — Telephone Encounter (Signed)
Reviewed results of recent labs with patient over the phone.  He has begun significant lifestyle modifications in the last 2-3 weeks and does wish to do a 48mo trial period OFF of metformin, even though his A1c went UP from 7.3 to 7.9% over the last few months.  He'll return for recheck HbA1c and office f/u in 48mo. He'll remain on all of his other meds at this time. Of note, his NMR lipoprofile sample was hemolyzed b/c of accidentally being frozen by the courier, so he'll be returning to our lab soon to repeat this test.

## 2011-01-17 ENCOUNTER — Other Ambulatory Visit: Payer: PRIVATE HEALTH INSURANCE

## 2011-01-17 DIAGNOSIS — E139 Other specified diabetes mellitus without complications: Secondary | ICD-10-CM

## 2011-01-21 LAB — NMR LIPOPROFILE WITHOUT LIPIDS: HDL Particle Number: 28 umol/L — ABNORMAL LOW (ref >=30.5)

## 2011-01-23 ENCOUNTER — Other Ambulatory Visit: Payer: Self-pay | Admitting: Family Medicine

## 2011-01-23 MED ORDER — COLESEVELAM HCL 625 MG PO TABS: 1875.0000 mg | ORAL_TABLET | Freq: Two times a day (BID) | ORAL | Status: DC

## 2011-01-27 ENCOUNTER — Encounter: Payer: Self-pay | Admitting: Family Medicine

## 2011-03-03 ENCOUNTER — Encounter: Payer: Self-pay | Admitting: Family Medicine

## 2011-03-03 DIAGNOSIS — E291 Testicular hypofunction: Secondary | ICD-10-CM

## 2011-03-03 HISTORY — DX: Testicular hypofunction: E29.1

## 2011-03-25 ENCOUNTER — Other Ambulatory Visit: Payer: Self-pay | Admitting: Family Medicine

## 2011-03-25 DIAGNOSIS — I10 Essential (primary) hypertension: Secondary | ICD-10-CM

## 2011-03-25 MED ORDER — CARVEDILOL 25 MG PO TABS: 25.0000 mg | ORAL_TABLET | Freq: Two times a day (BID) | ORAL | Status: DC

## 2011-03-25 NOTE — Telephone Encounter (Signed)
Patient requesting 90 day refill Carvedilol to [facility]

## 2011-03-25 NOTE — Telephone Encounter (Signed)
Last seen 01/07/11, follow up on 05/12/11.  90 day RX sent.

## 2011-04-02 ENCOUNTER — Other Ambulatory Visit: Payer: Self-pay | Admitting: Family Medicine

## 2011-04-02 DIAGNOSIS — E119 Type 2 diabetes mellitus without complications: Secondary | ICD-10-CM

## 2011-04-02 NOTE — Telephone Encounter (Signed)
Patient wants a refill on his metformin 90 day supply sent to [facility].

## 2011-04-03 MED ORDER — METFORMIN HCL 500 MG PO TABS: 500.0000 mg | ORAL_TABLET | Freq: Two times a day (BID) | ORAL | Status: DC

## 2011-04-03 NOTE — Telephone Encounter (Signed)
I spoke to the pt and he verified he is taking regular Metformin, not ER.  RX sent for 90-day, but pt aware he must keep 9/17 appt for more refills.  He is agreeable.

## 2011-05-12 ENCOUNTER — Encounter: Payer: Self-pay | Admitting: Family Medicine

## 2011-05-12 ENCOUNTER — Ambulatory Visit (INDEPENDENT_AMBULATORY_CARE_PROVIDER_SITE_OTHER): Payer: PRIVATE HEALTH INSURANCE | Admitting: Family Medicine

## 2011-05-12 ENCOUNTER — Other Ambulatory Visit: Payer: Self-pay | Admitting: Family Medicine

## 2011-05-12 VITALS — BP 132/84 | HR 66 | Temp 98.5°F | Ht 64.5 in | Wt 226.0 lb

## 2011-05-12 DIAGNOSIS — I2510 Atherosclerotic heart disease of native coronary artery without angina pectoris: Secondary | ICD-10-CM

## 2011-05-12 DIAGNOSIS — E785 Hyperlipidemia, unspecified: Secondary | ICD-10-CM

## 2011-05-12 DIAGNOSIS — Z23 Encounter for immunization: Secondary | ICD-10-CM

## 2011-05-12 DIAGNOSIS — I10 Essential (primary) hypertension: Secondary | ICD-10-CM

## 2011-05-12 DIAGNOSIS — E291 Testicular hypofunction: Secondary | ICD-10-CM

## 2011-05-12 DIAGNOSIS — E119 Type 2 diabetes mellitus without complications: Secondary | ICD-10-CM

## 2011-05-12 DIAGNOSIS — E669 Obesity, unspecified: Secondary | ICD-10-CM

## 2011-05-12 LAB — HEMOGLOBIN A1C: Hgb A1c MFr Bld: 7.8 % — ABNORMAL HIGH (ref 4.6–6.5)

## 2011-05-12 MED ORDER — METFORMIN HCL 1000 MG PO TABS: 1000.0000 mg | ORAL_TABLET | Freq: Two times a day (BID) | ORAL | Status: DC

## 2011-05-12 NOTE — Assessment & Plan Note (Signed)
He has NOT been taking androderm and he says he feels fine and does not want to be on this med at this time. Will d/c this med from [facility].

## 2011-05-12 NOTE — Assessment & Plan Note (Signed)
He has routine cardiology f/u 07/2011.

## 2011-05-12 NOTE — Assessment & Plan Note (Signed)
Problem stable.  Continue current medications and diet appropriate for this condition.  We have reviewed our general long term plan for this problem and also reviewed symptoms and signs that should prompt the patient to call or return to the office.  

## 2011-05-12 NOTE — Assessment & Plan Note (Signed)
He'll start welchol now.  Continue zocor 80mg  qd. We'll recheck NMR at next f/u in 55mo.

## 2011-05-12 NOTE — Assessment & Plan Note (Signed)
Sounds like control is not ideal, but it is difficult to get specific enough information from him to make a good judgement of this. Check HbA1c today. Encouraged him to make lifestyle mod that he can sustain. F/u 75mo.

## 2011-05-12 NOTE — Progress Notes (Signed)
OFFICE NOTE  05/12/2011  CC:  Chief Complaint  Patient presents with  . Diabetes  . Hypertension  . Hyperlipidemia    did not pick up welchol in May     HPI:   Patient is a 50 y.o. African-American male who is here for f/u DM 2, HTN, hyperlipidemia and obesity. Feels good.  Wants flu vaccine today. He did not get the welchol filled as we had planned, but wants to get this now. Says glucoses "okay" most of the time, but he finds it difficult to give specific numbers, didn't bring log book in with him. No outside bps to report.   Denies HA, CP, SOB, palpitations, or dizziness. He admits to making some drastic diet changes after I saw him last, lost 15 lbs or so, then gained it all back when he stopped the changes (vacation,etc). Asks about the possibility of diet pills.  Pertinent PMH:  Past Medical History  Diagnosis Date  . Hyperlipidemia   . 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 Coronary atherosclerosis of unspecified type of vessel, native or graft     RCA DES x 3 2007  . Acute myocardial infarction, unspecified site, episode of care unspecified   . Diverticulitis of colon (without mention of hemorrhage)     Admitted 01/2008  . 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  MRI abd 2010 at [facility] showed benign cystic lesion.  Marland Kitchen Unspecified hemorrhoids without mention of complication   . Esophageal reflux     ED visit 10/2009 for globus pharyngeus  . Obesity, unspecified   . Diabetes mellitus     non insulin dependent.   No D.R on exam 09/2009.  . Vitamin D deficiency 05/2010    [facility]  . Glaucoma 09/2009    Pressures normal but slight nerve thinning and corneal thickening noted on ophth exam.    MEDS;  NOTE: not taking welchol or androderm Outpatient Prescriptions Prior to Visit  Medication Sig Dispense Refill  . aspirin 81 MG tablet Take 81 mg by mouth daily.        .  carvedilol (COREG) 25 MG tablet Take 1 tablet (25 mg total) by mouth 2 (two) times daily with a meal.  180 tablet  0  . lisinopril (PRINIVIL,ZESTRIL) 40 MG tablet Take 1 tablet (40 mg total) by mouth daily.  90 tablet  4  . Loratadine-Pseudoephedrine (CLEAR-ATADINE D PO) Take 1 tablet by mouth as needed.       . metFORMIN (GLUCOPHAGE) 500 MG tablet Take 1 tablet (500 mg total) by mouth 2 (two) times daily with a meal.  180 tablet  0  . simvastatin (ZOCOR) 80 MG tablet 1 tab po qd  90 tablet  3  . colesevelam (WELCHOL) 625 MG tablet Take 3 tablets (1,875 mg total) by mouth 2 (two) times daily with a meal.  180 tablet  5  . testosterone (ANDRODERM) 2.5 MG/24HR Place 1 patch onto the skin every other day.         PE: Blood pressure 132/84, pulse 66, temperature 98.5 F (36.9 C), temperature source Oral, height 5' 4.5" (1.638 m), weight 226 lb (102.513 kg). Gen: Alert, well appearing.  Patient is oriented to person, place, time, and situation. No further exam today.  IMPRESSION AND PLAN:  Type II or unspecified type diabetes mellitus without mention of complication, not stated as  uncontrolled Sounds like control is not ideal, but it is difficult to get specific enough information from him to make a good judgement of this. Check HbA1c today. Encouraged him to make lifestyle mod that he can sustain. F/u 537mo.  HYPERLIPIDEMIA He'll start welchol now.  Continue zocor 80mg  qd. We'll recheck NMR at next f/u in 537mo.  HYPERTENSION Problem stable.  Continue current medications and diet appropriate for this condition.  We have reviewed our general long term plan for this problem and also reviewed symptoms and signs that should prompt the patient to call or return to the office.   Hypogonadism, male He has NOT been taking androderm and he says he feels fine and does not want to be on this med at this time. Will d/c this med from [facility].  OBESITY Encouraged small, sustainable changes in diet plus  small, sustainable increases in exercise. No diet pills.  CAD He has routine cardiology f/u 07/2011.   Flu vaccine IM today.  FOLLOW UP:  Return in about 4 months (around 09/11/2011) for f/u DM 2.

## 2011-05-12 NOTE — Progress Notes (Signed)
Quick Note:  Pls notify: his HbA1c was unchanged from 4 months ago (7.8%). His goal is <7%. I recommend increasing metformin to 1000 mg twice daily. Double-up on th 500mg  tabs until he runs out, then fill rx for 1000mg  tabs. I'll do eRx now.--PM ______

## 2011-05-12 NOTE — Assessment & Plan Note (Signed)
Encouraged small, sustainable changes in diet plus small, sustainable increases in exercise. No diet pills.

## 2011-05-22 LAB — CBC
HCT: 40.4
MCV: 87.2
Platelets: 218
Platelets: 228
WBC: 9.3
WBC: 9.4

## 2011-05-22 LAB — DIFFERENTIAL
Basophils Absolute: 0
Basophils Relative: 1
Eosinophils Absolute: 0.2
Eosinophils Relative: 2
Lymphs Abs: 2.2
Monocytes Absolute: 0.7
Monocytes Relative: 7
Neutrophils Relative %: 63

## 2011-05-22 LAB — TSH: TSH: 0.738

## 2011-05-22 LAB — URINALYSIS, ROUTINE W REFLEX MICROSCOPIC
Glucose, UA: NEGATIVE
Nitrite: NEGATIVE
Protein, ur: NEGATIVE
Urobilinogen, UA: 1

## 2011-05-22 LAB — COMPREHENSIVE METABOLIC PANEL
AST: 41 — ABNORMAL HIGH
Albumin: 4.1
Alkaline Phosphatase: 127 — ABNORMAL HIGH
Chloride: 104
GFR calc Af Amer: >60
Potassium: 3.8
Sodium: 141
Total Bilirubin: 1.3 — ABNORMAL HIGH

## 2011-05-22 LAB — BASIC METABOLIC PANEL
BUN: 6
Chloride: 108
Creatinine, Ser: 1.13
GFR calc non Af Amer: >60

## 2011-05-22 LAB — CALCIUM: Calcium: 8.9

## 2011-05-22 LAB — HEMOGLOBIN A1C: Mean Plasma Glucose: 208

## 2011-05-22 LAB — MAGNESIUM: Magnesium: 2.1

## 2011-05-30 LAB — URINALYSIS, ROUTINE W REFLEX MICROSCOPIC
Bilirubin Urine: NEGATIVE
Glucose, UA: NEGATIVE mg/dL
Hgb urine dipstick: NEGATIVE
Protein, ur: NEGATIVE mg/dL
Specific Gravity, Urine: 1.03 (ref 1.005–1.030)
Urobilinogen, UA: 0.2 mg/dL (ref 0.0–1.0)

## 2011-05-30 LAB — PROTIME-INR
INR: 1 (ref 0.00–1.49)
Prothrombin Time: 13.9 seconds (ref 11.6–15.2)

## 2011-05-30 LAB — DIFFERENTIAL
Basophils Absolute: 0 10*3/uL (ref 0.0–0.1)
Eosinophils Relative: 4 % (ref 0–5)
Lymphocytes Relative: 24 % (ref 12–46)
Lymphs Abs: 2.2 10*3/uL (ref 0.7–4.0)
Neutro Abs: 5.7 10*3/uL (ref 1.7–7.7)
Neutrophils Relative %: 63 % (ref 43–77)

## 2011-05-30 LAB — CBC
HCT: 48.5 % (ref 39.0–52.0)
MCHC: 32.2 g/dL (ref 30.0–36.0)
MCV: 85.9 fL (ref 78.0–100.0)
RBC: 5.64 MIL/uL (ref 4.22–5.81)

## 2011-05-30 LAB — COMPREHENSIVE METABOLIC PANEL
AST: 33 U/L (ref 0–37)
BUN: 15 mg/dL (ref 6–23)
CO2: 26 mEq/L (ref 19–32)
Calcium: 9.9 mg/dL (ref 8.4–10.5)
Creatinine, Ser: 1 mg/dL (ref 0.4–1.5)
GFR calc Af Amer: >60 mL/min (ref >60)
GFR calc non Af Amer: >60 mL/min (ref >60)
Total Bilirubin: 1.1 mg/dL (ref 0.3–1.2)

## 2011-06-27 ENCOUNTER — Other Ambulatory Visit: Payer: Self-pay | Admitting: Family Medicine

## 2011-06-27 NOTE — Telephone Encounter (Signed)
Pt last seen on 9/17, follow up on 09/11/11.  RX sent to pharmacy.

## 2011-09-01 ENCOUNTER — Encounter: Payer: Self-pay | Admitting: Family Medicine

## 2011-09-11 ENCOUNTER — Ambulatory Visit (INDEPENDENT_AMBULATORY_CARE_PROVIDER_SITE_OTHER): Payer: PRIVATE HEALTH INSURANCE | Admitting: Family Medicine

## 2011-09-11 ENCOUNTER — Encounter: Payer: Self-pay | Admitting: Family Medicine

## 2011-09-11 DIAGNOSIS — IMO0001 Reserved for inherently not codable concepts without codable children: Secondary | ICD-10-CM

## 2011-09-11 DIAGNOSIS — I10 Essential (primary) hypertension: Secondary | ICD-10-CM

## 2011-09-11 DIAGNOSIS — E1165 Type 2 diabetes mellitus with hyperglycemia: Secondary | ICD-10-CM

## 2011-09-11 DIAGNOSIS — E119 Type 2 diabetes mellitus without complications: Secondary | ICD-10-CM

## 2011-09-11 DIAGNOSIS — IMO0002 Reserved for concepts with insufficient information to code with codable children: Secondary | ICD-10-CM

## 2011-09-11 DIAGNOSIS — E785 Hyperlipidemia, unspecified: Secondary | ICD-10-CM

## 2011-09-11 LAB — HEMOGLOBIN A1C: Hgb A1c MFr Bld: 6.8 % — ABNORMAL HIGH (ref 4.6–6.5)

## 2011-09-11 NOTE — Progress Notes (Signed)
OFFICE VISIT  09/13/2011   CC:  Chief Complaint  Patient presents with  . Diabetes  . Hypertension     HPI:    Patient is a 51 y.o. African-American male who presents for 69mo f/u DM 2, hyperlipidemia, HTN. No acute complaints.  Compliant with meds, has been working on some basic dietary changes: limiting simple carbs, portion control, excessive fats, etc.  Home bp checks normal. Home glucoses: fasting 90-106, 2H PP 118-120.  Has hx of low testosterone but has essentially decided that he never felt bad from this and the androderm didn't make him feel any different.  He has stopped using this med.  Has a root canal coming up in the next few days so he's currently taking amoxil and vicodin.  He got his D.R screen this month and it showed no abnormalities.   Past Medical History  Diagnosis Date  . Hyperlipidemia   . 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 Coronary atherosclerosis of unspecified type of vessel, native or graft     RCA DES x 3 2007  . Acute myocardial infarction, unspecified site, episode of care unspecified   . Diverticulitis of colon (without mention of hemorrhage)     Admitted 01/2008  . 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  MRI abd 2010 at [facility] showed benign cystic lesion.  Marland Kitchen Unspecified hemorrhoids without mention of complication   . Esophageal reflux     ED visit 10/2009 for globus pharyngeus  . Obesity, unspecified   . Diabetes mellitus     non insulin dependent.   No D.R on exam 09/2009 or 09/01/11.  . Vitamin D deficiency 05/2010    [facility]  . Glaucoma 09/2009    Pressures normal but slight nerve thinning and corneal thickening noted on ophth exam.  . Hypogonadism, male 03/03/2011    Past Surgical History  Procedure Date  . Cholecystectomy   . Coronary angioplasty with stent placement 2007    DES x 3 to RCA.      Outpatient Prescriptions Prior  to Visit  Medication Sig Dispense Refill  . aspirin 81 MG tablet Take 81 mg by mouth daily.        . carvedilol (COREG) 25 MG tablet TAKE 1 TABLET BY MOUTH TWICE A DAY WITH A MEAL .  180 tablet  0  . colesevelam (WELCHOL) 625 MG tablet Take 3 tablets (1,875 mg total) by mouth 2 (two) times daily with a meal.  180 tablet  5  . lisinopril (PRINIVIL,ZESTRIL) 40 MG tablet Take 1 tablet (40 mg total) by mouth daily.  90 tablet  4  . metFORMIN (GLUCOPHAGE) 1000 MG tablet Take 1 tablet (1,000 mg total) by mouth 2 (two) times daily with a meal.  180 tablet  1  . simvastatin (ZOCOR) 80 MG tablet 1 tab po qd  90 tablet  3  . Loratadine-Pseudoephedrine (CLEAR-ATADINE D PO) Take 1 tablet by mouth as needed.       . metFORMIN (GLUCOPHAGE) 500 MG tablet Take 1 tablet (500 mg total) by mouth 2 (two) times daily with a meal.  180 tablet  0  . testosterone (ANDRODERM) 2.5 MG/24HR Place 1 patch onto the skin every other day.         No Known Allergies  ROS As per HPI  PE: Blood pressure 129/81, pulse 69, temperature 97.7 F (36.5  C), temperature source Temporal, height 5' 4.5" (1.638 m), weight 221 lb (100.245 kg). Gen: Alert, well appearing.  Patient is oriented to person, place, time, and situation. EXT: no c/c/e. Full diabetic foot exam normal today--see documented foot exam.  His feet look excellent.  LABS:  None today  IMPRESSION AND PLAN:  HYPERTENSION Problem stable.  Continue current medications and diet appropriate for this condition.  We have reviewed our general long term plan for this problem and also reviewed symptoms and signs that should prompt the patient to call or return to the office.   Type II or unspecified type diabetes mellitus without mention of complication, not stated as uncontrolled Problem stable.  Continue current medications and diet appropriate for this condition.  We have reviewed our general long term plan for this problem and also reviewed symptoms and signs that  should prompt the patient to call or return to the office. Check HbA1c and urine microalbumin/cr ratio today.  HYPERLIPIDEMIA Problem stable.  Continue current medications and diet appropriate for this condition.  We have reviewed our general long term plan for this problem and also reviewed symptoms and signs that should prompt the patient to call or return to the office. HDL labs today.     FOLLOW UP: Return in about 4 months (around 01/09/2012) for f/u DM, HTN, hyperlip.

## 2011-09-12 LAB — MICROALBUMIN / CREATININE URINE RATIO
Creatinine, Urine: 479.8 mg/dL
Microalb, Ur: 1.66 mg/dL (ref 0.00–1.89)

## 2011-09-13 NOTE — Assessment & Plan Note (Addendum)
Problem stable.  Continue current medications and diet appropriate for this condition.  We have reviewed our general long term plan for this problem and also reviewed symptoms and signs that should prompt the patient to call or return to the office. Check HbA1c and urine microalbumin/cr ratio today.

## 2011-09-13 NOTE — Assessment & Plan Note (Signed)
Problem stable.  Continue current medications and diet appropriate for this condition.  We have reviewed our general long term plan for this problem and also reviewed symptoms and signs that should prompt the patient to call or return to the office.  

## 2011-09-13 NOTE — Assessment & Plan Note (Signed)
Problem stable.  Continue current medications and diet appropriate for this condition.  We have reviewed our general long term plan for this problem and also reviewed symptoms and signs that should prompt the patient to call or return to the office. HDL labs today.

## 2011-09-23 ENCOUNTER — Other Ambulatory Visit: Payer: Self-pay | Admitting: Family Medicine

## 2011-09-23 ENCOUNTER — Telehealth: Payer: Self-pay | Admitting: Family Medicine

## 2011-09-23 NOTE — Telephone Encounter (Signed)
Pls notify pt that recent cholesterol recheck showed some good improvements, but I still recommend starting an additional medication called niaspan to help increase his "good" cholesterol and further decrease his risk of cardiovascular disease.  Also, one of his inflammation tests that we commonly do with cholesterol testing was quite elevated, but this is likely due to the recent tooth problems he has been having.  I want to repeat this test (hsCRP) when his dental problems are calmed down.  Let me know what he says about the niaspan.  thx

## 2011-09-23 NOTE — Telephone Encounter (Signed)
Last seen 09/11/11, follow up 12/2011.  RX sent 90 day x 1.

## 2011-09-25 NOTE — Telephone Encounter (Signed)
I have attempted to contact this patient by phone with the following results: left message to return my call on answering machine (home/mobile).  

## 2011-09-26 MED ORDER — NIACIN ER (ANTIHYPERLIPIDEMIC) 500 MG PO TBCR: EXTENDED_RELEASE_TABLET | ORAL | Status: DC

## 2011-09-26 NOTE — Telephone Encounter (Signed)
rx sent to [facility] today--PM

## 2011-09-26 NOTE — Telephone Encounter (Signed)
Pt agreeable to begin Niaspan.  Samples left at front desk for pt.  RX to [facility] please.

## 2011-10-06 ENCOUNTER — Encounter: Payer: Self-pay | Admitting: Family Medicine

## 2011-10-08 ENCOUNTER — Telehealth: Payer: Self-pay | Admitting: Family Medicine

## 2011-10-08 NOTE — Telephone Encounter (Signed)
Spoke to pt.  Pt would like HDL labs in 1/17 labs.  Advised I am out of the office this afternoon, but will have ready for him by 8:30 in the morning.  He is OK with that.

## 2011-10-08 NOTE — Telephone Encounter (Signed)
I have attempted to contact this patient by phone with the following results: message left to return my call with male at work number.

## 2011-10-08 NOTE — Telephone Encounter (Signed)
Please print a copy of the patient's most recent bloodwork, call when ready to pu. Thanks!

## 2011-10-08 NOTE — Telephone Encounter (Signed)
Patient returned Stephanie's call.  I told him he could come by and pick up his most recent labwork.

## 2011-10-08 NOTE — Telephone Encounter (Signed)
I have attempted to contact this patient by phone with the following results: not available at number given (work).

## 2011-10-27 ENCOUNTER — Other Ambulatory Visit: Payer: Self-pay | Admitting: Family Medicine

## 2011-10-27 DIAGNOSIS — E119 Type 2 diabetes mellitus without complications: Secondary | ICD-10-CM

## 2011-10-27 NOTE — Telephone Encounter (Signed)
Last seen 09/01/11, follow up in May 2013.  RX sent.

## 2012-01-08 ENCOUNTER — Encounter: Payer: Self-pay | Admitting: Family Medicine

## 2012-01-08 ENCOUNTER — Ambulatory Visit (INDEPENDENT_AMBULATORY_CARE_PROVIDER_SITE_OTHER): Payer: PRIVATE HEALTH INSURANCE | Admitting: Family Medicine

## 2012-01-08 VITALS — BP 160/103 | HR 61 | Temp 97.8°F | Ht 64.5 in | Wt 223.0 lb

## 2012-01-08 DIAGNOSIS — G4710 Hypersomnia, unspecified: Secondary | ICD-10-CM

## 2012-01-08 DIAGNOSIS — I10 Essential (primary) hypertension: Secondary | ICD-10-CM

## 2012-01-08 DIAGNOSIS — E785 Hyperlipidemia, unspecified: Secondary | ICD-10-CM

## 2012-01-08 DIAGNOSIS — I2510 Atherosclerotic heart disease of native coronary artery without angina pectoris: Secondary | ICD-10-CM

## 2012-01-08 DIAGNOSIS — E119 Type 2 diabetes mellitus without complications: Secondary | ICD-10-CM

## 2012-01-08 LAB — COMPREHENSIVE METABOLIC PANEL
Alkaline Phosphatase: 89 U/L (ref 39–117)
CO2: 27 mEq/L (ref 19–32)
Creatinine, Ser: 0.8 mg/dL (ref 0.4–1.5)
GFR: 132.44 mL/min (ref >60.00)
Glucose, Bld: 117 mg/dL — ABNORMAL HIGH (ref 70–99)
Sodium: 140 mEq/L (ref 135–145)
Total Bilirubin: 0.7 mg/dL (ref 0.3–1.2)

## 2012-01-08 LAB — LIPID PANEL
HDL: 37.7 mg/dL — ABNORMAL LOW (ref >39.00)
Triglycerides: 116 mg/dL (ref 0.0–149.0)
VLDL: 23.2 mg/dL (ref 0.0–40.0)

## 2012-01-08 LAB — POCT URINALYSIS DIPSTICK
Blood, UA: NEGATIVE
Ketones, UA: NEGATIVE
Protein, UA: NEGATIVE
Spec Grav, UA: 1.025
Urobilinogen, UA: 0.2
pH, UA: 7

## 2012-01-08 LAB — HEMOGLOBIN A1C: Hgb A1c MFr Bld: 7.5 % — ABNORMAL HIGH (ref 4.6–6.5)

## 2012-01-08 LAB — CBC WITH DIFFERENTIAL/PLATELET
Basophils Absolute: 0 10*3/uL (ref 0.0–0.1)
Eosinophils Relative: 11.1 % — ABNORMAL HIGH (ref 0.0–5.0)
HCT: 44.7 % (ref 39.0–52.0)
Hemoglobin: 14.6 g/dL (ref 13.0–17.0)
Lymphs Abs: 2.6 10*3/uL (ref 0.7–4.0)
MCV: 88.2 fl (ref 78.0–100.0)
Monocytes Absolute: 0.4 10*3/uL (ref 0.1–1.0)
Monocytes Relative: 7.2 % (ref 3.0–12.0)
Neutro Abs: 1.9 10*3/uL (ref 1.4–7.7)
Platelets: 235 10*3/uL (ref 150.0–400.0)
RDW: 14.6 % (ref 11.5–14.6)

## 2012-01-08 LAB — TSH: TSH: 0.47 u[IU]/mL (ref 0.35–5.50)

## 2012-01-08 MED ORDER — HYDROCHLOROTHIAZIDE 25 MG PO TABS: 25.0000 mg | ORAL_TABLET | Freq: Every day | ORAL | Status: DC

## 2012-01-08 NOTE — Assessment & Plan Note (Signed)
No clear etiology for recent poor control, but need to r/o OSA given his snoring and witnessed apnea spells + high risk sleep questionnaire today. Will add HCTZ 25mg  qd to his regimen, have checked CMET today and will recheck lytes/cr in 1 wk at f/u here in office when we review home bps. Will arrange for home sleep study to be done through [facility] sleep center.

## 2012-01-08 NOTE — Assessment & Plan Note (Signed)
Due for recheck today--he is fasting.  Also, will repeat the hs CRP that he never returned for a few months ago.

## 2012-01-08 NOTE — Progress Notes (Signed)
OFFICE NOTE  01/08/2012  CC:  Chief Complaint  Patient presents with  . Hypertension    elevated, also having HA     HPI: Patient is a 51 y.o. African-American male who is here for headaches and high BP recently. Two days or so, head throbbing at top.  BPs at home a little high last 2d.  Today has HA "all over" that he has associated with his bp being high in the past, mild/mod intensity at the most, described more as pressure than pain. Missed one carvedolol dose 2 days ago, otherwise fully compliant with meds. Drinking " a lot" of sodas last couple of days (caffeinated). Two weeks ago had respiratory illness/cough and this has been resolved for a week or so. Has taken zyrtec without D lately.  Ate asian food/[employer] recently but last was 4d/a, otherwise no signif dietary changes.  ROS: no vision c/o, no dizziness, no CP, no SOB. +snores chronically, +witnessed apnea during sleep (has never had a sleep study).  Pertinent PMH:  Past Medical History  Diagnosis Date  . Hyperlipidemia   . 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 Coronary atherosclerosis of unspecified type of vessel, native or graft     RCA DES x 3 2007  . Acute myocardial infarction, unspecified site, episode of care unspecified   . Diverticulitis of colon (without mention of hemorrhage)     Admitted 01/2008  . 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  MRI abd 2010 at [facility] showed benign cystic lesion.  Marland Kitchen Unspecified hemorrhoids without mention of complication   . Esophageal reflux     ED visit 10/2009 for globus pharyngeus  . Obesity, unspecified   . Diabetes mellitus     non insulin dependent.   No D.R on exam 09/2009 or 09/01/11.  . Vitamin d deficiency 05/2010    [facility]  . Glaucoma 09/2009    Pressures normal but slight nerve thinning and corneal thickening noted on ophth exam.  . Hypogonadism, male  03/03/2011   Past surgical, social, and family history reviewed and no changes noted since last office visit.  MEDS:  Outpatient Prescriptions Prior to Visit  Medication Sig Dispense Refill  . aspirin 81 MG tablet Take 81 mg by mouth daily.        . carvedilol (COREG) 25 MG tablet TAKE 1 TABLET BY MOUTH TWICE A DAY WITH A MEAL  180 tablet  1  . colesevelam (WELCHOL) 625 MG tablet Take 3 tablets (1,875 mg total) by mouth 2 (two) times daily with a meal.  180 tablet  5  . lisinopril (PRINIVIL,ZESTRIL) 40 MG tablet Take 1 tablet (40 mg total) by mouth daily.  90 tablet  4  . metFORMIN (GLUCOPHAGE) 1000 MG tablet Take 1 tablet (1,000 mg total) by mouth 2 (two) times daily with a meal.  180 tablet  1  . niacin (NIASPAN) 500 MG CR tablet Take your daily aspirin about 30 minutes prior to this pill to decrease the potential flushing effect.  30 tablet  5  . simvastatin (ZOCOR) 80 MG tablet 1 tab po qd  90 tablet  3  . Loratadine-Pseudoephedrine (CLEAR-ATADINE D PO) Take 1 tablet by mouth as needed.       . metFORMIN (GLUCOPHAGE) 500 MG tablet TAKE 1 TABLET BY MOUTH TWICE A DAY  180 tablet  0  PE: Blood pressure 160/103, pulse 61, temperature 97.8 F (36.6 C), temperature source Temporal, height 5' 4.5" (1.638 m), weight 223 lb (101.152 kg). Gen: Alert, well appearing.  Patient is oriented to person, place, time, and situation. ENT: Ears: EACs clear, normal epithelium.  TMs with good light reflex and landmarks bilaterally.  Eyes: no injection, icteris, swelling, or exudate.  EOMI, PERRLA. Nose: no drainage or injection but his turbinates are edematous.  Mouth: lips without lesion/swelling.  Oral mucosa pink and moist.  Dentition intact and without obvious caries or gingival swelling.  Oropharynx without erythema, exudate, or swelling.  Neck: supple/nontender.  No LAD, mass, or TM.  Carotid pulses 2+ bilaterally, without bruits. CV: RRR, no m/r/g.   LUNGS: CTA bilat, nonlabored resps, good aeration  in all lung fields. ABD: soft, NT, ND, BS normal.  No hepatospenomegaly or mass.  No bruits. EXT: no clubbing, cyanosis, or edema.   LAB: CC UA today was entirely normal.  IMPRESSION AND PLAN:  Hypertension, uncontrolled No clear etiology for recent poor control, but need to r/o OSA given his snoring and witnessed apnea spells + high risk sleep questionnaire today. Will add HCTZ 25mg  qd to his regimen, have checked CMET today and will recheck lytes/cr in 1 wk at f/u here in office when we review home bps. Will arrange for home sleep study to be done through [facility] sleep center.   HYPERLIPIDEMIA Due for recheck today--he is fasting.  Also, will repeat the hs CRP that he never returned for a few months ago.   Type II or unspecified type diabetes mellitus without mention of complication, not stated as uncontrolled Due for HbA1c today.  Urine microalbumin/cr 08/2011 normal. Lab Results  Component Value Date   HGBA1C 6.8* 09/11/2011       FOLLOW UP: 1 wk

## 2012-01-08 NOTE — Assessment & Plan Note (Signed)
Due for HbA1c today.  Urine microalbumin/cr 08/2011 normal. Lab Results  Component Value Date   HGBA1C 6.8* 09/11/2011

## 2012-01-15 ENCOUNTER — Ambulatory Visit: Payer: PRIVATE HEALTH INSURANCE | Admitting: Family Medicine

## 2012-01-21 ENCOUNTER — Encounter (HOSPITAL_BASED_OUTPATIENT_CLINIC_OR_DEPARTMENT_OTHER): Payer: PRIVATE HEALTH INSURANCE

## 2012-01-22 ENCOUNTER — Ambulatory Visit (INDEPENDENT_AMBULATORY_CARE_PROVIDER_SITE_OTHER): Payer: PRIVATE HEALTH INSURANCE | Admitting: Family Medicine

## 2012-01-22 ENCOUNTER — Encounter: Payer: Self-pay | Admitting: Family Medicine

## 2012-01-22 ENCOUNTER — Other Ambulatory Visit: Payer: Self-pay | Admitting: Family Medicine

## 2012-01-22 VITALS — BP 112/73 | HR 68 | Ht 64.5 in | Wt 220.0 lb

## 2012-01-22 DIAGNOSIS — G4733 Obstructive sleep apnea (adult) (pediatric): Secondary | ICD-10-CM

## 2012-01-22 DIAGNOSIS — Z125 Encounter for screening for malignant neoplasm of prostate: Secondary | ICD-10-CM | POA: Insufficient documentation

## 2012-01-22 DIAGNOSIS — I10 Essential (primary) hypertension: Secondary | ICD-10-CM

## 2012-01-22 NOTE — Assessment & Plan Note (Signed)
Strong FH. Last PSA:  Lab Results  Component Value Date   PSA 1.63 01/07/2011   DRE today normal.  PSA drawn today.

## 2012-01-22 NOTE — Progress Notes (Signed)
OFFICE NOTE  01/22/2012  CC:  Chief Complaint  Patient presents with  . Follow-up    hypertension     HPI: Patient is a 51 y.o. African-American male who is here for 2 wk f/u for recent poor control of HTN. I added HCTZ 25mg  on last visit.  However, he now says he thinks it was the allergy meds he had recently taken that made bp up last time and caused his sx's.  He stopped his zyrtec (also admits to "probably" having taken a pseudefed tab around that time as well) and followed bp at home and it gradually normalized over the course of a day and he returned to feeling completely well. Says he feels great today.  He asks about prostate cancer screening, when last PSA was, etc. He has strong FH of prostate ca: dad and uncle.  Pertinent PMH:  Past Medical History  Diagnosis Date  . Hyperlipidemia   . 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 Coronary atherosclerosis of unspecified type of vessel, native or graft     RCA DES x 3 2007  . Acute myocardial infarction, unspecified site, episode of care unspecified   . Diverticulitis of colon (without mention of hemorrhage)     Admitted 01/2008  . 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  MRI abd 2010 at [facility] showed benign cystic lesion.  Marland Kitchen Unspecified hemorrhoids without mention of complication   . Esophageal reflux     ED visit 10/2009 for globus pharyngeus  . Obesity, unspecified   . Diabetes mellitus     non insulin dependent.   No D.R on exam 09/2009 or 09/01/11.  . Vitamin d deficiency 05/2010    [facility]  . Glaucoma 09/2009    Pressures normal but slight nerve thinning and corneal thickening noted on ophth exam.  . Hypogonadism, male 03/03/2011    MEDS:  Outpatient Prescriptions Prior to Visit  Medication Sig Dispense Refill  . aspirin 81 MG tablet Take 81 mg by mouth daily.        . carvedilol (COREG) 25 MG tablet TAKE 1 TABLET BY  MOUTH TWICE A DAY WITH A MEAL  180 tablet  1  . cetirizine (ZYRTEC) 10 MG tablet Take 10 mg by mouth daily.      . colesevelam (WELCHOL) 625 MG tablet Take 3 tablets (1,875 mg total) by mouth 2 (two) times daily with a meal.  180 tablet  5  . lisinopril (PRINIVIL,ZESTRIL) 40 MG tablet Take 1 tablet (40 mg total) by mouth daily.  90 tablet  4  . metFORMIN (GLUCOPHAGE) 1000 MG tablet Take 1 tablet (1,000 mg total) by mouth 2 (two) times daily with a meal.  180 tablet  1  . niacin (NIASPAN) 500 MG CR tablet Take your daily aspirin about 30 minutes prior to this pill to decrease the potential flushing effect.  30 tablet  5  . simvastatin (ZOCOR) 80 MG tablet 1 tab po qd  90 tablet  3  . hydrochlorothiazide (HYDRODIURIL) 25 MG tablet Take 1 tablet (25 mg total) by mouth daily.  30 tablet  6    PE: Blood pressure 112/73, pulse 68, height 5' 4.5" (1.638 m), weight 220 lb (99.791 kg). Gen: Alert, well appearing.  Patient is oriented to person, place, time, and situation. DRE: anus normal, normal rectal tone, no mass or tenderness.  Prostate  is without palpable nodularity or asymmetry or tenderness.  I could not reach far enough up his anal canal to adequately estimate size of prostate.  IMPRESSION AND PLAN:  HYPERTENSION Stable.  Recent bump up from allergy medication. Continue coreg and lisinopril.  Prostate cancer screening Strong FH. Last PSA:  Lab Results  Component Value Date   PSA 1.63 01/07/2011   DRE today normal.  PSA drawn today.   OSA (obstructive sleep apnea) He has a sleep study set up in [facility] sleep lab soon.      FOLLOW UP: 4 mo

## 2012-01-22 NOTE — Assessment & Plan Note (Signed)
He has a sleep study set up in [facility] sleep lab soon.

## 2012-01-22 NOTE — Assessment & Plan Note (Signed)
Stable.  Recent bump up from allergy medication. Continue coreg and lisinopril.

## 2012-01-27 ENCOUNTER — Ambulatory Visit (HOSPITAL_BASED_OUTPATIENT_CLINIC_OR_DEPARTMENT_OTHER): Payer: PRIVATE HEALTH INSURANCE | Attending: Family Medicine | Admitting: General Practice

## 2012-01-27 VITALS — Ht 67.0 in | Wt 218.0 lb

## 2012-01-27 DIAGNOSIS — G4733 Obstructive sleep apnea (adult) (pediatric): Secondary | ICD-10-CM | POA: Insufficient documentation

## 2012-01-27 DIAGNOSIS — G4710 Hypersomnia, unspecified: Secondary | ICD-10-CM

## 2012-01-27 DIAGNOSIS — I10 Essential (primary) hypertension: Secondary | ICD-10-CM

## 2012-02-02 ENCOUNTER — Encounter: Payer: Self-pay | Admitting: *Deleted

## 2012-02-03 ENCOUNTER — Encounter (HOSPITAL_BASED_OUTPATIENT_CLINIC_OR_DEPARTMENT_OTHER): Payer: PRIVATE HEALTH INSURANCE

## 2012-02-05 DIAGNOSIS — G4733 Obstructive sleep apnea (adult) (pediatric): Secondary | ICD-10-CM

## 2012-02-05 NOTE — Procedures (Signed)
NAME:  Henry Perry, Henry Perry NO.:  [id number]  MEDICAL RECORD NO.:  [id number]          PATIENT TYPE:  OUT  LOCATION:  SLEEP CENTER                 FACILITY:  [facility]  PHYSICIAN:  Barbaraann Share, MD,FCCPDATE OF BIRTH:  12-Nov-1962  DATE OF STUDY:  01/27/2012                           NOCTURNAL POLYSOMNOGRAM  REFERRING PHYSICIAN:  Jeoffrey Massed, MD  LOCATION:  Sleep Lab.  INDICATION FOR STUDY:  Hypersomnia with sleep apnea.  EPWORTH SLEEPINESS SCORE:  12.  MEDICATIONS:  SLEEP ARCHITECTURE:  The patient had total sleep time of 317 minutes with no slow-wave sleep and only 95 minutes of REM.  Sleep onset latency was normal at 13 minutes and REM onset was normal at 62 minutes.  Sleep efficiency was mildly reduced at 83%.  RESPIRATORY DATA:  The patient was found to have 8 apneas and 54 obstructive hypopneas, giving him an apnea-hypopnea index of 12 events per hour.  The events occurred in all body positions, and there was loud snoring noted throughout.  The patient did not meet split night protocol secondary to the majority of his events occurring well after 1 a.m.  OXYGEN DATA:  There was O2 desaturation transiently as low as 84%.  CARDIAC DATA:  No clinically significant arrhythmias were noted.  MOVEMENT-PARASOMNIA:  The patient had no significant leg jerks or other abnormal behavior seen.  IMPRESSIONS-RECOMMENDATIONS:  Mild obstructive sleep apnea/hypopnea syndrome with an AHI of 12 events per hour and oxygen desaturation as low as 84%.  Treatment for this degree of sleep apnea can include a trial of weight loss alone, upper airway surgery, dental appliance, and also a continuous positive airway pressure.  It should be noted the patient did not meet split night criteria secondary to the majority of his events occurring well after 1 a.m.     Barbaraann Share, MD,FCCP Diplomate, American Board of Sleep Medicine    [facility]/MEDQ  D:  02/05/2012 14:40:28  T:   02/05/2012 16:10:96  Job:  [id number]

## 2012-02-23 DIAGNOSIS — G4733 Obstructive sleep apnea (adult) (pediatric): Secondary | ICD-10-CM

## 2012-02-23 HISTORY — DX: Obstructive sleep apnea (adult) (pediatric): G47.33

## 2012-02-25 ENCOUNTER — Other Ambulatory Visit: Payer: Self-pay | Admitting: Cardiology

## 2012-03-11 ENCOUNTER — Other Ambulatory Visit: Payer: Self-pay | Admitting: Family Medicine

## 2012-03-11 NOTE — Telephone Encounter (Signed)
eScribe request for refill on welchol Last filled 01/22/11, 180 x 5 Last seen on 01/22/12 Follow up 05/24/12 RX sent

## 2012-03-16 ENCOUNTER — Telehealth: Payer: Self-pay | Admitting: Family Medicine

## 2012-03-16 DIAGNOSIS — G4733 Obstructive sleep apnea (adult) (pediatric): Secondary | ICD-10-CM

## 2012-03-17 NOTE — Telephone Encounter (Signed)
I have attempted to contact this patient by phone with the following results: left message to return my call on answering machine (mobile).  

## 2012-03-17 NOTE — Telephone Encounter (Signed)
Patient study showed mild sleep apnea not meeting the level requiring treatment unless he is highly symptomatic, he should be instructed to keep weight down and avoid alcohol within 3 hours of bedtime. Will defer to Dr Milinda Cave whether he thinks the patient's clinical picture warrants further treatment

## 2012-03-17 NOTE — Telephone Encounter (Signed)
Dr. Abner Greenspan, results for Sleep Study are in under Notes tab.  Can you please advise.  Thanks.

## 2012-03-18 NOTE — Telephone Encounter (Signed)
RC from pt.  Results and recommendations given.  Pt aware message also being sent to Dr. Milinda Cave for any further recommendations.

## 2012-03-18 NOTE — Telephone Encounter (Signed)
Handwritten note left on my desk stating patient returned call at 1436.  Message left for patient to return my call.

## 2012-03-22 ENCOUNTER — Encounter: Payer: Self-pay | Admitting: Family Medicine

## 2012-03-22 NOTE — Telephone Encounter (Signed)
I have now entered this order--PM

## 2012-03-22 NOTE — Telephone Encounter (Signed)
Pt is agreeable with CPAP.  He would prefer in-home titration.

## 2012-03-22 NOTE — Telephone Encounter (Signed)
Pls call pt and let him know I think he should give CPAP a try.  If he's willing, please set up titration.  --thx

## 2012-03-23 ENCOUNTER — Other Ambulatory Visit: Payer: Self-pay | Admitting: *Deleted

## 2012-03-23 MED ORDER — NIACIN ER (ANTIHYPERLIPIDEMIC) 500 MG PO TBCR: 500.0000 mg | EXTENDED_RELEASE_TABLET | Freq: Every day | ORAL | Status: DC

## 2012-03-23 MED ORDER — COLESEVELAM HCL 625 MG PO TABS: 1875.0000 mg | ORAL_TABLET | Freq: Two times a day (BID) | ORAL | Status: DC

## 2012-03-23 NOTE — Telephone Encounter (Signed)
Pt requests 90 day refill on Welchol and Niaspan.   Last seen on 01/22/12  Follow up 05/24/12 RX's printed and left at front desk for patient.

## 2012-04-05 ENCOUNTER — Other Ambulatory Visit: Payer: Self-pay | Admitting: Family Medicine

## 2012-05-05 ENCOUNTER — Ambulatory Visit: Payer: PRIVATE HEALTH INSURANCE | Admitting: Cardiology

## 2012-05-06 ENCOUNTER — Ambulatory Visit: Payer: PRIVATE HEALTH INSURANCE | Admitting: Family Medicine

## 2012-05-10 ENCOUNTER — Ambulatory Visit: Payer: PRIVATE HEALTH INSURANCE | Admitting: Family Medicine

## 2012-05-19 ENCOUNTER — Ambulatory Visit (INDEPENDENT_AMBULATORY_CARE_PROVIDER_SITE_OTHER): Payer: PRIVATE HEALTH INSURANCE | Admitting: Cardiology

## 2012-05-19 ENCOUNTER — Encounter: Payer: Self-pay | Admitting: Cardiology

## 2012-05-19 VITALS — BP 140/82 | HR 72 | Ht 66.0 in | Wt 270.0 lb

## 2012-05-19 DIAGNOSIS — I10 Essential (primary) hypertension: Secondary | ICD-10-CM

## 2012-05-19 DIAGNOSIS — I2510 Atherosclerotic heart disease of native coronary artery without angina pectoris: Secondary | ICD-10-CM

## 2012-05-19 DIAGNOSIS — E785 Hyperlipidemia, unspecified: Secondary | ICD-10-CM

## 2012-05-19 NOTE — Patient Instructions (Addendum)
Your physician wants you to follow-up in: ONE YEAR WITH DR CRENSHAW IN [place] You will receive a reminder letter in the mail two months in advance. If you don't receive a letter, please call our office to schedule the follow-up appointment.  

## 2012-05-19 NOTE — Assessment & Plan Note (Signed)
Continue statin. Lipids and liver monitored by primary care. 

## 2012-05-19 NOTE — Assessment & Plan Note (Signed)
Blood pressure controlled. Continue present medications. 

## 2012-05-19 NOTE — Assessment & Plan Note (Signed)
Continue aspirin and statin.plan repeat functional study when he returns in one year.

## 2012-05-19 NOTE — Progress Notes (Signed)
HPI: Pleasant male for fu of CAD. He underwent cardiac catheterization in 2007 secondary to a myocardial infarction. He was found to have an ejection fraction of 65%. He had a proximal 95% stenosis in his right coronary artery and the distal vessel was occluded. He subsequently underwent PCI of his right coronary artery using 3 drug-eluting stents, 2 of which were overlapping. He had nonobstructive disease in his LAD with a 50% mid and a 40% lesion further downstream. He had a 50% second diagonal as well. A Myoview was performed in October of 2011. At that time, his ejection fraction was 56%. No ischemia or infarction. Last echocardiogram in January of 2008 revealed normal LV function. Since I last saw him in April of 2012, the patient denies any dyspnea on exertion, orthopnea, PND, pedal edema, palpitations, syncope or chest pain.   Current Outpatient Prescriptions  Medication Sig Dispense Refill  . aspirin 81 MG tablet Take 81 mg by mouth daily.        . carvedilol (COREG) 25 MG tablet TAKE 1 TABLET BY MOUTH TWICE A DAY WITH A MEAL  180 tablet  0  . cetirizine (ZYRTEC) 10 MG tablet Take 10 mg by mouth as needed.       Marland Kitchen lisinopril (PRINIVIL,ZESTRIL) 40 MG tablet TAKE 1 TABLET BY MOUTH ONCE A DAY  90 tablet  4  . metFORMIN (GLUCOPHAGE) 1000 MG tablet TAKE 1 TABLET (1,000 MG TOTAL) BY MOUTH 2 (TWO) TIMES DAILY WITH A MEAL.  180 tablet  1  . niacin (NIASPAN) 500 MG CR tablet Take 1 tablet (500 mg total) by mouth at bedtime. Take your daily aspirin about 30 minutes prior to this pill to decrease the potential flushing effect.  90 tablet  1  . simvastatin (ZOCOR) 80 MG tablet TAKE 1 TABLET BY MOUTH DAILY  90 tablet  0     Past Medical History  Diagnosis Date  . Hyperlipidemia   . 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 Coronary atherosclerosis of unspecified type of vessel, native or graft     RCA DES x 3 2007  . Acute myocardial infarction, unspecified site, episode of care  unspecified   . Diverticulitis of colon (without mention of hemorrhage)     Admitted 01/2008  . 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  MRI abd 2010 at [facility] showed benign cystic lesion.  Marland Kitchen Unspecified hemorrhoids without mention of complication   . Esophageal reflux     ED visit 10/2009 for globus pharyngeus  . Obesity, unspecified   . Diabetes mellitus     non insulin dependent.   No D.R on exam 09/2009 or 09/01/11.  . Vitamin d deficiency 05/2010    [facility]  . Glaucoma 09/2009    Pressures normal but slight nerve thinning and corneal thickening noted on ophth exam.  . Hypogonadism, male 03/03/2011  . OSA (obstructive sleep apnea) 02/2012    Mild; recommended CPAP titration/trial as of 03/22/12.    Past Surgical History  Procedure Date  . Cholecystectomy   . Coronary angioplasty with stent placement 2007    DES x 3 to RCA.      History   Social History  . Marital Status: Married    Spouse Name: N/A    Number of Children: 2  . Years of Education: N/A   Occupational History  . Buisness  owner     real estate   Social History Main Topics  . Smoking status: Former Smoker    Quit date: 08/25/1990  . Smokeless tobacco: Never Used  . Alcohol Use: No  . Drug Use: No  . Sexually Active: Not on file   Other Topics Concern  . Not on file   Social History Narrative   Occupation: Psychologist, sport and exercise - Science writer is a former smoker (quit about age 42). Alcohol Use - noDaily Caffeine Use -1Illicit Drug Use - noMarried, 1 son, 1 daughter.  Lives in [place].  Originally from [place] but lived in [place] a while before moving back to [place].    ROS: no fevers or chills, productive cough, hemoptysis, dysphasia, odynophagia, melena, hematochezia, dysuria, hematuria, rash, seizure activity, orthopnea, PND, pedal edema, claudication. Remaining systems are negative.  Physical Exam: Well-developed  well-nourished in no acute distress.  Skin is warm and dry.  HEENT is normal.  Neck is supple.  Chest is clear to auscultation with normal expansion.  Cardiovascular exam is regular rate and rhythm.  Abdominal exam nontender or distended. No masses palpated. Extremities show no edema. neuro grossly intact  ECG sinus rhythm at a rate of 72. No ST changes.

## 2012-05-24 ENCOUNTER — Ambulatory Visit (INDEPENDENT_AMBULATORY_CARE_PROVIDER_SITE_OTHER): Payer: PRIVATE HEALTH INSURANCE | Admitting: Family Medicine

## 2012-05-24 DIAGNOSIS — Z0279 Encounter for issue of other medical certificate: Secondary | ICD-10-CM

## 2012-05-24 NOTE — Progress Notes (Deleted)
OFFICE NOTE  05/24/2012  CC: No chief complaint on file.    HPI: Patient is a 50 y.o. African-American male who is here for 4 mo f/u DM 2, HTN, Hyperlipidemia.  Pertinent PMH:  Past Medical History  Diagnosis Date  . Hyperlipidemia   . 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 Coronary atherosclerosis of unspecified type of vessel, native or graft     RCA DES x 3 2007  . Acute myocardial infarction, unspecified site, episode of care unspecified   . Diverticulitis of colon (without mention of hemorrhage)     Admitted 01/2008  . 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  MRI abd 2010 at [facility] showed benign cystic lesion.  Marland Kitchen Unspecified hemorrhoids without mention of complication   . Esophageal reflux     ED visit 10/2009 for globus pharyngeus  . Obesity, unspecified   . Diabetes mellitus     non insulin dependent.   No D.R on exam 09/2009 or 09/01/11.  . Vitamin d deficiency 05/2010    [facility]  . Glaucoma 09/2009    Pressures normal but slight nerve thinning and corneal thickening noted on ophth exam.  . Hypogonadism, male 03/03/2011  . OSA (obstructive sleep apnea) 02/2012    Mild; recommended CPAP titration/trial as of 03/22/12.    MEDS:  Outpatient Prescriptions Prior to Visit  Medication Sig Dispense Refill  . aspirin 81 MG tablet Take 81 mg by mouth daily.        . carvedilol (COREG) 25 MG tablet TAKE 1 TABLET BY MOUTH TWICE A DAY WITH A MEAL  180 tablet  0  . cetirizine (ZYRTEC) 10 MG tablet Take 10 mg by mouth as needed.       Marland Kitchen lisinopril (PRINIVIL,ZESTRIL) 40 MG tablet TAKE 1 TABLET BY MOUTH ONCE A DAY  90 tablet  4  . metFORMIN (GLUCOPHAGE) 1000 MG tablet TAKE 1 TABLET (1,000 MG TOTAL) BY MOUTH 2 (TWO) TIMES DAILY WITH A MEAL.  180 tablet  1  . niacin (NIASPAN) 500 MG CR tablet Take 1 tablet (500 mg total) by mouth at bedtime. Take your daily aspirin about 30 minutes prior to  this pill to decrease the potential flushing effect.  90 tablet  1  . simvastatin (ZOCOR) 80 MG tablet TAKE 1 TABLET BY MOUTH DAILY  90 tablet  0    PE: There were no vitals taken for this visit. ***  IMPRESSION AND PLAN: ***  FOLLOW UP: ***

## 2012-07-08 ENCOUNTER — Other Ambulatory Visit: Payer: Self-pay | Admitting: Family Medicine

## 2012-07-08 NOTE — Telephone Encounter (Signed)
Advised pt he is past due for follow up and I am only able to give him a 30 day supply of meds.  Pt has scheduled appt for Monday.  Pt has enough meds to last until that time and he is agreeable with plan to refill at that time.

## 2012-07-08 NOTE — Telephone Encounter (Signed)
eScribe request for refill on METFORMIN Last filled - 01/23/12, #180 X 1  eScribe request for refill on COREG Last filled - 04/05/12,#180 X 0  eScribe request for refill on SIMVASTATIN Last filled - 04/05/12, #90 X 0  Last seen on - 01/22/12  Follow up - 3-4 WEEKS, Pt has several cancellations and 2 recent no show appts. Message left for patient to return my call.  Pt wants 90 day supply and protocol only allows 30 day until he has appt.

## 2012-07-12 ENCOUNTER — Ambulatory Visit (INDEPENDENT_AMBULATORY_CARE_PROVIDER_SITE_OTHER): Payer: PRIVATE HEALTH INSURANCE | Admitting: Family Medicine

## 2012-07-12 ENCOUNTER — Ambulatory Visit: Payer: PRIVATE HEALTH INSURANCE | Admitting: Family Medicine

## 2012-07-12 ENCOUNTER — Encounter: Payer: Self-pay | Admitting: Family Medicine

## 2012-07-12 VITALS — BP 152/90 | HR 60 | Ht 64.5 in | Wt 221.0 lb

## 2012-07-12 DIAGNOSIS — G4733 Obstructive sleep apnea (adult) (pediatric): Secondary | ICD-10-CM

## 2012-07-12 DIAGNOSIS — E782 Mixed hyperlipidemia: Secondary | ICD-10-CM

## 2012-07-12 DIAGNOSIS — I10 Essential (primary) hypertension: Secondary | ICD-10-CM

## 2012-07-12 DIAGNOSIS — Z23 Encounter for immunization: Secondary | ICD-10-CM

## 2012-07-12 DIAGNOSIS — IMO0001 Reserved for inherently not codable concepts without codable children: Secondary | ICD-10-CM

## 2012-07-12 DIAGNOSIS — E785 Hyperlipidemia, unspecified: Secondary | ICD-10-CM

## 2012-07-12 LAB — LIPID PANEL
HDL: 33.3 mg/dL — ABNORMAL LOW (ref >39.00)
LDL Cholesterol: 89 mg/dL (ref 0–99)
Total CHOL/HDL Ratio: 4
Triglycerides: 89 mg/dL (ref 0.0–149.0)
VLDL: 17.8 mg/dL (ref 0.0–40.0)

## 2012-07-12 LAB — BASIC METABOLIC PANEL
BUN: 9 mg/dL (ref 6–23)
CO2: 29 mEq/L (ref 19–32)
Chloride: 106 mEq/L (ref 96–112)
Creatinine, Ser: 1 mg/dL (ref 0.4–1.5)
Glucose, Bld: 114 mg/dL — ABNORMAL HIGH (ref 70–99)
Potassium: 4.5 mEq/L (ref 3.5–5.1)

## 2012-07-12 LAB — HEMOGLOBIN A1C: Hgb A1c MFr Bld: 7.4 % — ABNORMAL HIGH (ref 4.6–6.5)

## 2012-07-12 MED ORDER — SIMVASTATIN 80 MG PO TABS: 80.0000 mg | ORAL_TABLET | Freq: Every day | ORAL | Status: DC

## 2012-07-12 MED ORDER — CARVEDILOL 25 MG PO TABS: 25.0000 mg | ORAL_TABLET | Freq: Two times a day (BID) | ORAL | Status: DC

## 2012-07-12 MED ORDER — LISINOPRIL 40 MG PO TABS: 40.0000 mg | ORAL_TABLET | Freq: Every day | ORAL | Status: DC

## 2012-07-12 MED ORDER — METFORMIN HCL 1000 MG PO TABS: 1000.0000 mg | ORAL_TABLET | Freq: Two times a day (BID) | ORAL | Status: DC

## 2012-07-12 MED ORDER — LIRAGLUTIDE 18 MG/3ML ~~LOC~~ SOLN: SUBCUTANEOUS | Status: DC

## 2012-07-12 NOTE — Progress Notes (Signed)
OFFICE NOTE  07/12/2012  CC:  Chief Complaint  Patient presents with  . Follow-up    DM and HTN, did not take Carvedilol this AM     HPI: Patient is a 51 y.o. African-American male who is here for 6 mo f/u DM 2, HTN, hyperlipidemia, OSA. Fasting glucoses consistently around 100.  Trying to eat basic diabetic diet. Rare 2H PP check in the 200s.   He rarely checks bp at home and it is usally when he has a HA--gets normal readings. He didn't take carvedilol today.  Pertinent PMH:  Past Medical History  Diagnosis Date  . Hyperlipidemia   . 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 Coronary atherosclerosis of unspecified type of vessel, native or graft     RCA DES x 3 2007  . Acute myocardial infarction, unspecified site, episode of care unspecified   . Diverticulitis of colon (without mention of hemorrhage)     Admitted 01/2008  . 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  MRI abd 2010 at [facility] showed benign cystic lesion.  Marland Kitchen Unspecified hemorrhoids without mention of complication   . Esophageal reflux     ED visit 10/2009 for globus pharyngeus  . Obesity, unspecified   . Diabetes mellitus     non insulin dependent.   No D.R on exam 09/2009 or 09/01/11.  . Vitamin D deficiency 05/2010    [facility]  . Glaucoma(365) 09/2009    Pressures normal but slight nerve thinning and corneal thickening noted on ophth exam.  . Hypogonadism, male 03/03/2011  . OSA (obstructive sleep apnea) 02/2012    Mild; recommended CPAP titration/trial as of 03/22/12.    MEDS:  Outpatient Prescriptions Prior to Visit  Medication Sig Dispense Refill  . aspirin 81 MG tablet Take 81 mg by mouth daily.        . carvedilol (COREG) 25 MG tablet TAKE 1 TABLET BY MOUTH TWICE A DAY WITH A MEAL  180 tablet  0  . cetirizine (ZYRTEC) 10 MG tablet Take 10 mg by mouth as needed.       Marland Kitchen lisinopril (PRINIVIL,ZESTRIL) 40 MG  tablet TAKE 1 TABLET BY MOUTH ONCE A DAY  90 tablet  4  . metFORMIN (GLUCOPHAGE) 1000 MG tablet TAKE 1 TABLET (1,000 MG TOTAL) BY MOUTH 2 (TWO) TIMES DAILY WITH A MEAL.  180 tablet  1  . niacin (NIASPAN) 500 MG CR tablet Take 1 tablet (500 mg total) by mouth at bedtime. Take your daily aspirin about 30 minutes prior to this pill to decrease the potential flushing effect.  90 tablet  1  . simvastatin (ZOCOR) 80 MG tablet TAKE 1 TABLET BY MOUTH DAILY  90 tablet  0   Last reviewed on 07/12/2012 10:33 AM by Jeoffrey Massed, MD  PE: Blood pressure 152/90, pulse 60, height 5' 4.5" (1.638 m), weight 221 lb (100.245 kg), SpO2 94.00%. Gen: Alert, well appearing.  Patient is oriented to person, place, time, and situation. CV: RRR, no m/r/g.   LUNGS: CTA bilat, nonlabored resps, good aeration in all lung fields. ABD: soft, NT, ND, BS normal.  No hepatospenomegaly or mass.  No bruits. EXT: no clubbing, cyanosis, or edema.   LABS: none today  IMPRESSION AND PLAN:  Type II or unspecified type diabetes mellitus without mention of complication, uncontrolled Home monitoring indicates inadequate control on metformin monotherapy. Discussed  options today and decided on trial of victoza, start at 0.6mg  qd and increase to 1.2 mg qd in 7d.  Nurse taught pt injection method and he did his first injection of the victoza while in the office today.  Gave 1 month-worth of samples of victoza today. Continue monitoring fasting and one 2H PP daily and bring these back for review in 1 mo.  Will check HbA1c today. Pneumovax and flu vaccine given IM today.  Obstructive sleep apnea +sleep study but needs new referral for CPAP home titration study/CPAP machine and supplies. Ordered this through [facility] sleep [facility] today.  HYPERTENSION Problem stable.  Continue current medications and diet appropriate for this condition.  We have reviewed our general long term plan for this problem and also reviewed symptoms and signs that  should prompt the patient to call or return to the office. Check cr/lytes today.  HYPERLIPIDEMIA Check FLP and AST/ALT today.   An After Visit Summary was printed and given to the patient.  FOLLOW UP: 1 month

## 2012-07-12 NOTE — Assessment & Plan Note (Signed)
Check FLP and AST/ALT today.

## 2012-07-12 NOTE — Assessment & Plan Note (Signed)
+  sleep study but needs new referral for CPAP home titration study/CPAP machine and supplies. Ordered this through [facility] sleep center today.

## 2012-07-12 NOTE — Assessment & Plan Note (Addendum)
Home monitoring indicates inadequate control on metformin monotherapy. Discussed options today and decided on trial of victoza, start at 0.6mg  qd and increase to 1.2 mg qd in 7d.  Nurse taught pt injection method and he did his first injection of the victoza while in the office today.  Gave 1 month-worth of samples of victoza today. Continue monitoring fasting and one 2H PP daily and bring these back for review in 1 mo.  Will check HbA1c today. Pneumovax and flu vaccine given IM today.

## 2012-07-12 NOTE — Assessment & Plan Note (Signed)
Problem stable.  Continue current medications and diet appropriate for this condition.  We have reviewed our general long term plan for this problem and also reviewed symptoms and signs that should prompt the patient to call or return to the office. Check cr/lytes today. 

## 2012-08-06 ENCOUNTER — Other Ambulatory Visit: Payer: Self-pay

## 2012-08-11 ENCOUNTER — Encounter: Payer: Self-pay | Admitting: Family Medicine

## 2012-08-11 ENCOUNTER — Ambulatory Visit (INDEPENDENT_AMBULATORY_CARE_PROVIDER_SITE_OTHER): Payer: PRIVATE HEALTH INSURANCE | Admitting: Family Medicine

## 2012-08-11 ENCOUNTER — Telehealth: Payer: Self-pay | Admitting: Family Medicine

## 2012-08-11 VITALS — BP 168/119 | HR 79 | Ht 64.5 in | Wt 219.0 lb

## 2012-08-11 DIAGNOSIS — Z23 Encounter for immunization: Secondary | ICD-10-CM

## 2012-08-11 DIAGNOSIS — IMO0001 Reserved for inherently not codable concepts without codable children: Secondary | ICD-10-CM

## 2012-08-11 DIAGNOSIS — G4733 Obstructive sleep apnea (adult) (pediatric): Secondary | ICD-10-CM

## 2012-08-11 DIAGNOSIS — I10 Essential (primary) hypertension: Secondary | ICD-10-CM

## 2012-08-11 DIAGNOSIS — E782 Mixed hyperlipidemia: Secondary | ICD-10-CM

## 2012-08-11 MED ORDER — LIRAGLUTIDE 18 MG/3ML ~~LOC~~ SOLN: SUBCUTANEOUS | Status: DC

## 2012-08-11 MED ORDER — HYDROCHLOROTHIAZIDE 25 MG PO TABS: 25.0000 mg | ORAL_TABLET | Freq: Every day | ORAL | Status: DC

## 2012-08-11 NOTE — Progress Notes (Signed)
OFFICE NOTE  08/11/2012  CC:  Chief Complaint  Patient presents with  . Follow-up    DM, Victoza     HPI: Patient is a 51 y.o. African-American male who is here for 1 mo f/u after getting started on victoza injections.   He reports mild hypoglycemia initially, even when only on 0.6mg  dosing, but then he stopped his night time dose of metformin and glucoses have been normal in fasting and postprandial states.  BPs up here at office checks lately, says he feels pulse pounding in head sometimes upon early morning awakening. We had tried to add HCTZ in the past but he never filled the rx--he can't recall why.   Pertinent PMH:  Past Medical History  Diagnosis Date  . Hyperlipidemia   . 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 Coronary atherosclerosis of unspecified type of vessel, native or graft     RCA DES x 3 2007  . Acute myocardial infarction, unspecified site, episode of care unspecified   . Diverticulitis of colon (without mention of hemorrhage)     Admitted 01/2008  . 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  MRI abd 2010 at [facility] showed benign cystic lesion.  Marland Kitchen Unspecified hemorrhoids without mention of complication   . Esophageal reflux     ED visit 10/2009 for globus pharyngeus  . Obesity, unspecified   . Diabetes mellitus     non insulin dependent.   No D.R on exam 09/2009 or 09/01/11.  . Vitamin D deficiency 05/2010    [facility]  . Glaucoma(365) 09/2009    Pressures normal but slight nerve thinning and corneal thickening noted on ophth exam.  . Hypogonadism, male 03/03/2011  . OSA (obstructive sleep apnea) 02/2012    Mild; recommended CPAP titration/trial as of 03/22/12.    MEDS:  Outpatient Prescriptions Prior to Visit  Medication Sig Dispense Refill  . aspirin 81 MG tablet Take 81 mg by mouth daily.        . carvedilol (COREG) 25 MG tablet Take 1 tablet (25 mg total)  by mouth 2 (two) times daily with a meal.  180 tablet  4  . cetirizine (ZYRTEC) 10 MG tablet Take 10 mg by mouth as needed.       . Liraglutide (VICTOZA) 18 MG/3ML SOLN 0.6mg  SQ qAM x 7d, then increase to 1.2 mg SQ qAM  6 mL  0  . lisinopril (PRINIVIL,ZESTRIL) 40 MG tablet Take 1 tablet (40 mg total) by mouth daily.  90 tablet  4  . metFORMIN (GLUCOPHAGE) 1000 MG tablet Take 1 tablet (1,000 mg total) by mouth 2 (two) times daily with a meal.  180 tablet  1  . niacin (NIASPAN) 500 MG CR tablet Take 1 tablet (500 mg total) by mouth at bedtime. Take your daily aspirin about 30 minutes prior to this pill to decrease the potential flushing effect.  90 tablet  1  . simvastatin (ZOCOR) 80 MG tablet Take 1 tablet (80 mg total) by mouth at bedtime.  90 tablet  1   Last reviewed on 08/11/2012 10:29 AM by Jeoffrey Massed, MD  PE: Blood pressure 168/119, pulse 79, height 5' 4.5" (1.638 m), weight 219 lb (99.338 kg). Gen: Alert, well appearing.  Patient is oriented to person, place, time, and situation. No further exam today.  IMPRESSION AND PLAN:  HYPERTENSION Poor control lately. Will  add HCTZ 25mg  qd to his coreg 25mg  bid and his lisinopril 40mg  daily. Encouraged pt to monitor bp at home. F/u 1 mo, check BMET at that time.  Type II or unspecified type diabetes mellitus without mention of complication, uncontrolled Doing fine now on victoza 1.2 mg SQ qd and metformin 1000mg  qd. Recheck HbA1c in 2 mo.  Obstructive sleep apnea He needs to contact [facility] sleep lab and set up in-lab CPAP titration.   An After Visit Summary was printed and given to the patient.  FOLLOW UP:  60mo

## 2012-08-11 NOTE — Assessment & Plan Note (Signed)
He needs to contact [facility] sleep lab and set up in-lab CPAP titration.

## 2012-08-11 NOTE — Assessment & Plan Note (Signed)
Doing fine now on victoza 1.2 mg SQ qd and metformin 1000mg  qd. Recheck HbA1c in 2 mo.

## 2012-08-11 NOTE — Assessment & Plan Note (Signed)
Poor control lately. Will add HCTZ 25mg  qd to his coreg 25mg  bid and his lisinopril 40mg  daily. Encouraged pt to monitor bp at home. F/u 1 mo, check BMET at that time.

## 2012-08-11 NOTE — Telephone Encounter (Signed)
Dr. Milinda Cave, there is still an open referral from 07/12/12 for a sleep study.  From what I can tell, originally patient was referred to [facility], and per my call today to [facility], when [place] [facility] contacted the patient to arrange the study, patient declined.  Now, the current 06/2012 referral in epic looks like it was sent to [facility], and per [facility] when they contacted the patient, he stated he was waiting on [facility] to return his call.  I have been unable to reach patient.  I see patient has an appointment with you today, will you please clarify with patient if he does or does not want to have a sleep study?

## 2012-08-31 NOTE — Telephone Encounter (Signed)
On his 08/11/12 visit I confirmed with him that he needed a CPAP titration study.  For some reason he did not get real good interaction with [facility] and couldn't get things coordinated for a home titration study, so he decided to not deal with them anymore.  His only option at that point was to get his titration studay through [facility] sleep center (where he got his original diagnostic sleep study).  He was hoping to get this done as a home titration through [facility] but we were told by [facility] that day that they do not do home CPAP titratons, so I informed patient that he would have to call the [facility] sleep center and arrange a CPAP titration study in their lab.  He expressed understanding and said he would carry through with this. --thx

## 2012-09-03 ENCOUNTER — Other Ambulatory Visit: Payer: Self-pay | Admitting: Family Medicine

## 2012-09-03 NOTE — Telephone Encounter (Signed)
Last seen 08/11/12 Refill sent per [facility] refill protocol.

## 2012-09-15 ENCOUNTER — Ambulatory Visit: Payer: PRIVATE HEALTH INSURANCE | Admitting: Family Medicine

## 2012-09-16 ENCOUNTER — Encounter: Payer: Self-pay | Admitting: Family Medicine

## 2012-09-16 ENCOUNTER — Encounter: Payer: Self-pay | Admitting: Internal Medicine

## 2012-09-16 ENCOUNTER — Ambulatory Visit (INDEPENDENT_AMBULATORY_CARE_PROVIDER_SITE_OTHER): Payer: BC Managed Care – PPO | Admitting: Family Medicine

## 2012-09-16 VITALS — BP 130/89 | HR 85 | Ht 64.5 in | Wt 221.0 lb

## 2012-09-16 DIAGNOSIS — K921 Melena: Secondary | ICD-10-CM

## 2012-09-16 DIAGNOSIS — I10 Essential (primary) hypertension: Secondary | ICD-10-CM

## 2012-09-16 DIAGNOSIS — IMO0001 Reserved for inherently not codable concepts without codable children: Secondary | ICD-10-CM

## 2012-09-16 LAB — CBC WITH DIFFERENTIAL/PLATELET
Basophils Absolute: 0 10*3/uL (ref 0.0–0.1)
Eosinophils Absolute: 0.2 10*3/uL (ref 0.0–0.7)
Lymphocytes Relative: 49.4 % — ABNORMAL HIGH (ref 12.0–46.0)
MCHC: 32.9 g/dL (ref 30.0–36.0)
Neutro Abs: 1.6 10*3/uL (ref 1.4–7.7)
Neutrophils Relative %: 31.9 % — ABNORMAL LOW (ref 43.0–77.0)
RDW: 14.2 % (ref 11.5–14.6)

## 2012-09-16 LAB — BASIC METABOLIC PANEL
Chloride: 103 mEq/L (ref 96–112)
Creatinine, Ser: 1 mg/dL (ref 0.4–1.5)
Potassium: 3.8 mEq/L (ref 3.5–5.1)

## 2012-09-16 NOTE — Progress Notes (Signed)
OFFICE NOTE  09/16/2012  CC:  Chief Complaint  Patient presents with  . Follow-up    DM, HTN; needs HCTZ refill     HPI: Patient is a 52 y.o. African-American male who is here for 5 wk f/u HTN and DM 2. Recently started victoza and HCTZ.  Says home bp checks have been normal 120s over 60s but he doesn't trust his cuff--no clear reason why (upper arm cuff).  Has had bright red blood in stool for last couple of weeks.  No pain in anal area or lower abdomen.  Appetite is good. Glucoses around 100 avg fasting and 150 or so 2 H PP.  No side effects from victoza.  Pertinent PMH:  Past Medical History  Diagnosis Date  . Hyperlipidemia   . 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 Coronary atherosclerosis of unspecified type of vessel, native or graft     RCA DES x 3 2007  . Acute myocardial infarction, unspecified site, episode of care unspecified   . Diverticulitis of colon (without mention of hemorrhage)     Admitted 01/2008  . 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  MRI abd 2010 at [facility] showed benign cystic lesion.  Marland Kitchen Unspecified hemorrhoids without mention of complication   . Esophageal reflux     ED visit 10/2009 for globus pharyngeus  . Obesity, unspecified   . Diabetes mellitus     non insulin dependent.   No D.R on exam 09/2009 or 09/01/11.  . Vitamin D deficiency 05/2010    [facility]  . Glaucoma(365) 09/2009    Pressures normal but slight nerve thinning and corneal thickening noted on ophth exam.  . Hypogonadism, male 03/03/2011  . OSA (obstructive sleep apnea) 02/2012    Mild; recommended CPAP titration/trial as of 03/22/12.   Past Surgical History  Procedure Date  . Cholecystectomy   . Coronary angioplasty with stent placement 2007    DES x 3 to RCA.    Colonoscopy about 5 yrs ago at [facility]  MEDS:  Outpatient Prescriptions Prior to Visit  Medication Sig Dispense  Refill  . aspirin 81 MG tablet Take 81 mg by mouth daily.        . carvedilol (COREG) 25 MG tablet Take 1 tablet (25 mg total) by mouth 2 (two) times daily with a meal.  180 tablet  4  . hydrochlorothiazide (HYDRODIURIL) 25 MG tablet Take 1 tablet (25 mg total) by mouth daily.  30 tablet  2  . Liraglutide (VICTOZA) 18 MG/3ML SOLN 1.2 mg SQ qAM  12 mL  2  . lisinopril (PRINIVIL,ZESTRIL) 40 MG tablet Take 1 tablet (40 mg total) by mouth daily.  90 tablet  4  . NIASPAN 500 MG CR tablet TAKE YOUR DAILY ASPIRIN ABOUT 30 MIN PRIOR TO THIS PILL TO DECREASE THEPOTENTIAL FLUSHING EFFECT  30 tablet  5  . simvastatin (ZOCOR) 80 MG tablet Take 1 tablet (80 mg total) by mouth at bedtime.  90 tablet  1  . [DISCONTINUED] metFORMIN (GLUCOPHAGE) 1000 MG tablet Take 1 tablet (1,000 mg total) by mouth 2 (two) times daily with a meal.  180 tablet  1  . cetirizine (ZYRTEC) 10 MG tablet Take 10 mg by mouth as needed.        Last reviewed on 09/16/2012  1:48 PM by Jeoffrey Massed, MD  PE: Blood pressure  130/89, pulse 85, height 5' 4.5" (1.638 m), weight 221 lb (100.245 kg). Gen: Alert, well appearing.  Patient is oriented to person, place, time, and situation. Rectal: no lesions, no fissures, no hemorrhoids.  Digital exam reveals normal sized prostate without nodule or tenderness.  No masses palpable in rectum.   IMPRESSION AND PLAN:  1) HTN: improved/normal.  Continue current med regimen.  Check BMET today.  2) DM 2: improving significantly on Victoza.  Continue this and metformin, recheck in 4-6 wks and get HbA1c at that time.  3) Hematochezia: likely internal hemorrhoids.  Since this has been persistent for 2 wks, will check CBC today and refer to GI for further e/m.  FOLLOW UP: 4-6 wks

## 2012-09-16 NOTE — Addendum Note (Signed)
Addended by: Jeoffrey Massed on: 09/16/2012 02:07 PM   Modules accepted: Orders

## 2012-09-22 NOTE — Progress Notes (Signed)
A user error has taken place: encounter opened in error, closed for administrative reasons.

## 2012-10-08 ENCOUNTER — Ambulatory Visit: Payer: BC Managed Care – PPO | Admitting: Internal Medicine

## 2012-10-11 ENCOUNTER — Encounter: Payer: Self-pay | Admitting: Internal Medicine

## 2012-10-27 ENCOUNTER — Encounter: Payer: Self-pay | Admitting: Internal Medicine

## 2012-10-27 ENCOUNTER — Ambulatory Visit (INDEPENDENT_AMBULATORY_CARE_PROVIDER_SITE_OTHER): Payer: BC Managed Care – PPO | Admitting: Internal Medicine

## 2012-10-27 VITALS — BP 110/60 | HR 63 | Ht 65.5 in | Wt 221.5 lb

## 2012-10-27 DIAGNOSIS — K625 Hemorrhage of anus and rectum: Secondary | ICD-10-CM

## 2012-10-27 MED ORDER — NA SULFATE-K SULFATE-MG SULF 17.5-3.13-1.6 GM/177ML PO SOLN: ORAL | Status: DC

## 2012-10-27 NOTE — Progress Notes (Signed)
  Subjective:    Patient ID: Henry Perry, male    DOB: 09-21-1960, 52 y.o.   MRN: [id number]  HPI The patient is here with complaints of rectal bleeding since December. He tried preparation H and that has reduced the bleeding. Bright red blood on the toilet paper and also in the commode. No significant change in bowel habits. He does have loose stools post-prandial, since starting metformin. GI ROS otherwise negative. He had a colonoscopy > 5 years ago at the [place] due to abdominal pain. He has a hx of complicated diverticulitis with abscess successfully treated with antibiotics.  Medications, allergies, past medical history, past surgical history, family history and social history are reviewed and updated in the EMR.   Review of Systems No chest pain or dyspnea reported     Objective:   Physical Exam General:  Well-developed, well-nourished and in no acute distress Eyes:  anicteric. ENT:   Mouth and posterior pharynx free of lesions.  Neck:   supple w/o thyromegaly or mass.  Lungs: Clear to auscultation bilaterally. Heart:  S1S2, no rubs, murmurs, gallops. Abdomen:  soft, non-tender, no hepatosplenomegaly, hernia, or mass and BS+.  Rectal: deferred Lymph:  no cervical or supraclavicular adenopathy. Extremities:   no edema Skin   no rash. Neuro:  A&O x 3.  Psych:  appropriate mood and  Affect.   Data Reviewed: Lab Results  Component Value Date   WBC 5.1 09/16/2012   HGB 15.1 09/16/2012   HCT 45.9 09/16/2012   MCV 86.3 09/16/2012   PLT 232.0 09/16/2012   PCP notes       Assessment & Plan:  Rectal bleeding - Plan: Evaluate for cause with colonoscopy. The risks and benefits as well as alternatives of endoscopic procedure(s) have been discussed and reviewed. All questions answered. The patient agrees to proceed.  WU:[id number],NWGNFA H, MD

## 2012-10-27 NOTE — Patient Instructions (Addendum)
You have been scheduled for a colonoscopy with propofol. Please follow written instructions given to you at your visit today.  Please use the prep kit we gave you today. If you use inhalers (even only as needed) or a CPAP machine, please bring them with you on the day of your procedure.  Thank you for choosing me and [facility].  Iva Boop, M.D., [facility]

## 2012-10-28 ENCOUNTER — Other Ambulatory Visit: Payer: Self-pay | Admitting: Internal Medicine

## 2012-10-28 ENCOUNTER — Encounter: Payer: Self-pay | Admitting: Internal Medicine

## 2012-10-28 ENCOUNTER — Ambulatory Visit (AMBULATORY_SURGERY_CENTER): Payer: BC Managed Care – PPO | Admitting: Internal Medicine

## 2012-10-28 VITALS — BP 122/88 | HR 73 | Temp 97.3°F | Resp 21 | Ht 65.0 in | Wt 221.0 lb

## 2012-10-28 DIAGNOSIS — K625 Hemorrhage of anus and rectum: Secondary | ICD-10-CM

## 2012-10-28 DIAGNOSIS — K5730 Diverticulosis of large intestine without perforation or abscess without bleeding: Secondary | ICD-10-CM

## 2012-10-28 DIAGNOSIS — K648 Other hemorrhoids: Secondary | ICD-10-CM

## 2012-10-28 LAB — GLUCOSE, CAPILLARY: Glucose-Capillary: 98 mg/dL (ref 70–99)

## 2012-10-28 MED ORDER — SODIUM CHLORIDE 0.9 % IV SOLN: 500.0000 mL | INTRAVENOUS | Status: DC

## 2012-10-28 MED ORDER — HYDROCORTISONE ACETATE 25 MG RE SUPP: 25.0000 mg | Freq: Every day | RECTAL | Status: DC

## 2012-10-28 NOTE — Op Note (Signed)
[facility] [address] [place], [address]   COLONOSCOPY PROCEDURE REPORT  PATIENT: Halil, Rentz  MR#: [id number] BIRTHDATE: 08-22-1961 , 51  yrs. old GENDER: Male ENDOSCOPIST: Iva Boop, MD, [facility] PROCEDURE DATE:  10/28/2012 PROCEDURE:   Colonoscopy, diagnostic ASA CLASS:   Class III INDICATIONS:Rectal Bleeding. MEDICATIONS: Propofol (Diprivan) 180 mg IV, These medications were titrated to patient response per physician's verbal order, and MAC sedation, administered by CRNA  DESCRIPTION OF PROCEDURE:   After the risks benefits and alternatives of the procedure were thoroughly explained, informed consent was obtained.  A digital rectal exam revealed no abnormalities of the rectum, A digital rectal exam revealed no prostatic nodules, and A digital rectal exam revealed the prostate was not enlarged.   The LB CF-[id number]  endoscope was introduced through the anus and advanced to the cecum, which was identified by both the appendix and ileocecal valve. No adverse events experienced.   The quality of the prep was Suprep excellent The instrument was then slowly withdrawn as the colon was fully examined.      COLON FINDINGS: Severe diverticulosis was noted in the sigmoid colon.   Moderate sized internal hemorrhoids were found.   The colon mucosa was otherwise normal.  Retroflexed views revealed internal hemorrhoids. The time to cecum=1 minutes 12 seconds. Withdrawal time=7 minutes 47 seconds.  The scope was withdrawn and the procedure completed. COMPLICATIONS: There were no complications.  ENDOSCOPIC IMPRESSION: 1.   Severe diverticulosis was noted in the sigmoid colon 2.   Moderate sized internal hemorrhoids - cause of bleeding 3.   The colon mucosa was otherwise normal - excellent prep  RECOMMENDATIONS: 1.  Repeat colonoscopy 10 years. 2.  Consider ligation if persistent hemorrhoidal bleeding 3.  Trial of hydrocortisone suppositories  now.   eSigned:  Iva Boop, MD, [facility] 10/28/2012 4:47 PM  cc: Earley Favor, MD and The Patient

## 2012-10-28 NOTE — Patient Instructions (Addendum)
The colonoscopy showed internal hemorrhoids (cause of bleeding) and diverticulosis.  I have prescribed medicated suppositories to help the hemorrhoids.  If they continue to bother you we can discuss banding of them - I anticipate having an in office procedure to do that in the near future and will follow-up with you about that.  Next routine colonoscopy in about 10 years (2024).  Thank you for choosing me and [facility].  Iva Boop, MD, FACG   YOU HAD AN ENDOSCOPIC PROCEDURE TODAY AT THE [facility]: Refer to the procedure report that was given to you for any specific questions about what was found during the examination.  If the procedure report does not answer your questions, please call your gastroenterologist to clarify.  If you requested that your care partner not be given the details of your procedure findings, then the procedure report has been included in a sealed envelope for you to review at your convenience later.  YOU SHOULD EXPECT: Some feelings of bloating in the abdomen. Passage of more gas than usual.  Walking can help get rid of the air that was put into your GI tract during the procedure and reduce the bloating. If you had a lower endoscopy (such as a colonoscopy or flexible sigmoidoscopy) you may notice spotting of blood in your stool or on the toilet paper. If you underwent a bowel prep for your procedure, then you may not have a normal bowel movement for a few days.  DIET: Your first meal following the procedure should be a light meal and then it is ok to progress to your normal diet.  A half-sandwich or bowl of soup is an example of a good first meal.  Heavy or fried foods are harder to digest and may make you feel nauseous or bloated.  Likewise meals heavy in dairy and vegetables can cause extra gas to form and this can also increase the bloating.  Drink plenty of fluids but you should avoid alcoholic beverages for 24 hours.  ACTIVITY: Your  care partner should take you home directly after the procedure.  You should plan to take it easy, moving slowly for the rest of the day.  You can resume normal activity the day after the procedure however you should NOT DRIVE or use heavy machinery for 24 hours (because of the sedation medicines used during the test).    SYMPTOMS TO REPORT IMMEDIATELY: A gastroenterologist can be reached at any hour.  During normal business hours, 8:30 AM to 5:00 PM Monday through Friday, call [phone].  After hours and on weekends, please call the GI answering service at [phone] who will take a message and have the physician on call contact you.   Following lower endoscopy (colonoscopy or flexible sigmoidoscopy):  Excessive amounts of blood in the stool  Significant tenderness or worsening of abdominal pains  Swelling of the abdomen that is new, acute  Fever of 100F or higher    FOLLOW UP: If any biopsies were taken you will be contacted by phone or by letter within the next 1-3 weeks.  Call your gastroenterologist if you have not heard about the biopsies in 3 weeks.  Our staff will call the home number listed on your records the next business day following your procedure to check on you and address any questions or concerns that you may have at that time regarding the information given to you following your procedure. This is a courtesy call and so if there is  no answer at the home number and we have not heard from you through the emergency physician on call, we will assume that you have returned to your regular daily activities without incident.  SIGNATURES/CONFIDENTIALITY: You and/or your care partner have signed paperwork which will be entered into your electronic medical record.  These signatures attest to the fact that that the information above on your After Visit Summary has been reviewed and is understood.  Full responsibility of the confidentiality of this discharge information lies with  you and/or your care-partner.   Diverticulosis, hemorrohoid information given.  Trial of hydrocortisone suppositories.

## 2012-10-28 NOTE — Progress Notes (Signed)
Patient did not experience any of the following events: a burn prior to discharge; a fall within the facility; wrong site/side/patient/procedure/implant event; or a hospital transfer or hospital admission upon discharge from the facility. (G8907) Patient did not have preoperative order for IV antibiotic SSI prophylaxis. (G8918)  

## 2012-10-29 ENCOUNTER — Telehealth: Payer: Self-pay

## 2012-10-29 NOTE — Telephone Encounter (Signed)
No answer

## 2012-11-01 ENCOUNTER — Encounter: Payer: Self-pay | Admitting: Family Medicine

## 2012-11-23 ENCOUNTER — Other Ambulatory Visit: Payer: Self-pay | Admitting: *Deleted

## 2012-11-23 MED ORDER — HYDROCHLOROTHIAZIDE 25 MG PO TABS: 25.0000 mg | ORAL_TABLET | Freq: Every day | ORAL | Status: DC

## 2012-11-23 NOTE — Telephone Encounter (Signed)
Faxed refill request received from pharmacy for HYDROCHLOROTHIAZIDE Last filled by MD on 09/03/12 per [facility]).  Last time filled through [facility] was 08/11/12, #30 x 2 to [facility] Last seen on 09/16/12 Follow up 4-6 weeks.  No appt in system. 30 day supply sent.

## 2012-12-27 ENCOUNTER — Telehealth: Payer: Self-pay | Admitting: Emergency Medicine

## 2012-12-27 MED ORDER — HYDROCHLOROTHIAZIDE 25 MG PO TABS: 25.0000 mg | ORAL_TABLET | Freq: Every day | ORAL | Status: DC

## 2012-12-27 NOTE — Telephone Encounter (Signed)
Rx request to pharmacy; MUST HAVE APPT FOR ADDITIONAL REFILLS/SLS

## 2012-12-27 NOTE — Telephone Encounter (Signed)
Patient needs medication refill, I tried to call him back to verify that its blood pressure medication, he told me Oxycodone(this is not listed on his med list)I think that was a mistake, He was not available.

## 2013-02-22 ENCOUNTER — Other Ambulatory Visit: Payer: Self-pay | Admitting: Family Medicine

## 2013-02-22 MED ORDER — HYDROCHLOROTHIAZIDE 25 MG PO TABS: 25.0000 mg | ORAL_TABLET | Freq: Every day | ORAL | Status: DC

## 2013-02-22 NOTE — Telephone Encounter (Signed)
Pt told me that that he will make an apopintment in July.  Diane to make appointment with pt.  [facility]  Patient said he did need 90 day supply so his insurance would cover better.

## 2013-03-03 ENCOUNTER — Other Ambulatory Visit: Payer: Self-pay

## 2013-03-08 ENCOUNTER — Encounter (INDEPENDENT_AMBULATORY_CARE_PROVIDER_SITE_OTHER): Payer: BC Managed Care – PPO | Admitting: Family Medicine

## 2013-03-08 ENCOUNTER — Encounter: Payer: Self-pay | Admitting: Family Medicine

## 2013-03-08 NOTE — Progress Notes (Deleted)
OFFICE NOTE  03/08/2013  CC:  Chief Complaint  Patient presents with  . Hypertension  . Diabetes     HPI: Patient is a 52 y.o. {Race:[id number]} male who is here for ***  Pertinent PMH:  *** MEDS:  Outpatient Prescriptions Prior to Visit  Medication Sig Dispense Refill  . aspirin 81 MG tablet Take 81 mg by mouth daily.        . carvedilol (COREG) 25 MG tablet Take 1 tablet (25 mg total) by mouth 2 (two) times daily with a meal.  180 tablet  4  . cetirizine (ZYRTEC) 10 MG tablet Take 10 mg by mouth as needed.       . hydrochlorothiazide (HYDRODIURIL) 25 MG tablet Take 1 tablet (25 mg total) by mouth daily.  90 tablet  0  . hydrocortisone (ANUSOL-HC) 25 MG suppository Place 1 suppository (25 mg total) rectally at bedtime. For 5 nights and then as needed  12 suppository  0  . Liraglutide (VICTOZA) 18 MG/3ML SOLN 1.2 mg SQ qAM  12 mL  2  . lisinopril (PRINIVIL,ZESTRIL) 40 MG tablet Take 1 tablet (40 mg total) by mouth daily.  90 tablet  4  . metFORMIN (GLUCOPHAGE) 1000 MG tablet Take 1,000 mg by mouth daily with supper.      Marland Kitchen NIASPAN 500 MG CR tablet TAKE YOUR DAILY ASPIRIN ABOUT 30 MIN PRIOR TO THIS PILL TO DECREASE THEPOTENTIAL FLUSHING EFFECT  30 tablet  5  . simvastatin (ZOCOR) 80 MG tablet Take 1 tablet (80 mg total) by mouth at bedtime.  90 tablet  1   No facility-administered medications prior to visit.    PE: Blood pressure 137/87, pulse 68, temperature 97.9 F (36.6 C), temperature source Temporal, resp. rate 16, height 5' 4.5" (1.638 m), weight 225 lb (102.059 kg), SpO2 98.00%. ***  IMPRESSION AND PLAN: ***  FOLLOW UP: ***

## 2013-03-17 ENCOUNTER — Telehealth: Payer: Self-pay | Admitting: Cardiology

## 2013-03-17 NOTE — Telephone Encounter (Signed)
New Prob  Pt is wanting to know if you could recommend a new PCP for him. He said he is seeing someone in [place] but he is looking to switch to someone new.

## 2013-03-17 NOTE — Telephone Encounter (Signed)
If he wants to be seen in [place], there is Dr Abner Greenspan; could also see one of  primary care docs in [place]. Henry Perry

## 2013-03-17 NOTE — Telephone Encounter (Signed)
Returned call to patient he stated he wanted Dr.Crenshaw to recommend a new PCP.Message sent to [facility] for advice.

## 2013-03-18 NOTE — Telephone Encounter (Signed)
Spoke with pt, Aware of dr crenshaw's recommendations.  °

## 2013-03-22 ENCOUNTER — Other Ambulatory Visit: Payer: Self-pay | Admitting: Family Medicine

## 2013-03-22 DIAGNOSIS — IMO0001 Reserved for inherently not codable concepts without codable children: Secondary | ICD-10-CM

## 2013-03-22 DIAGNOSIS — Z23 Encounter for immunization: Secondary | ICD-10-CM

## 2013-03-22 DIAGNOSIS — I10 Essential (primary) hypertension: Secondary | ICD-10-CM

## 2013-03-22 DIAGNOSIS — E782 Mixed hyperlipidemia: Secondary | ICD-10-CM

## 2013-03-22 MED ORDER — NIACIN ER (ANTIHYPERLIPIDEMIC) 500 MG PO TBCR: EXTENDED_RELEASE_TABLET | ORAL | Status: DC

## 2013-03-22 MED ORDER — LIRAGLUTIDE 18 MG/3ML ~~LOC~~ SOPN: 1.2000 mg | PEN_INJECTOR | Freq: Every morning | SUBCUTANEOUS | Status: DC

## 2013-04-07 ENCOUNTER — Other Ambulatory Visit: Payer: Self-pay | Admitting: Family Medicine

## 2013-04-07 NOTE — Telephone Encounter (Signed)
Medication denied, patient needs appointment.  He hasn't been seen since January.

## 2013-04-08 ENCOUNTER — Other Ambulatory Visit: Payer: Self-pay | Admitting: *Deleted

## 2013-04-08 DIAGNOSIS — I10 Essential (primary) hypertension: Secondary | ICD-10-CM

## 2013-04-08 DIAGNOSIS — IMO0001 Reserved for inherently not codable concepts without codable children: Secondary | ICD-10-CM

## 2013-04-08 DIAGNOSIS — E782 Mixed hyperlipidemia: Secondary | ICD-10-CM

## 2013-04-08 DIAGNOSIS — Z23 Encounter for immunization: Secondary | ICD-10-CM

## 2013-04-08 MED ORDER — SIMVASTATIN 80 MG PO TABS: 80.0000 mg | ORAL_TABLET | Freq: Every day | ORAL | Status: DC

## 2013-05-02 ENCOUNTER — Other Ambulatory Visit: Payer: Self-pay | Admitting: Family Medicine

## 2013-05-02 ENCOUNTER — Telehealth: Payer: Self-pay | Admitting: Family Medicine

## 2013-05-02 MED ORDER — HYDROCHLOROTHIAZIDE 25 MG PO TABS: 25.0000 mg | ORAL_TABLET | Freq: Every day | ORAL | Status: AC

## 2013-05-02 NOTE — Telephone Encounter (Signed)
Patient needs an appointment for anymore refills. Patient last seen 09/16/12 and did not follow up as requested by physician.

## 2013-05-02 NOTE — Telephone Encounter (Signed)
OK, rx sent.

## 2013-05-02 NOTE — Telephone Encounter (Signed)
Patient last appointment was 09/16/12.  Please advise refill.

## 2013-05-02 NOTE — Telephone Encounter (Signed)
Refill request came in. I contacted patient to schedule an appt. Patient stated the last time he was here (03/08/13) that he waited an hour to see the doctor and then had to leave. Patient states he has an upcoming appointment with cardiologist. Patient said he will make a follow-up appointment after his Rx has been filled.

## 2013-05-23 ENCOUNTER — Other Ambulatory Visit: Payer: Self-pay | Admitting: Family Medicine

## 2013-05-23 ENCOUNTER — Encounter: Payer: Self-pay | Admitting: Cardiology

## 2013-05-23 ENCOUNTER — Ambulatory Visit (INDEPENDENT_AMBULATORY_CARE_PROVIDER_SITE_OTHER): Payer: BC Managed Care – PPO | Admitting: Cardiology

## 2013-05-23 VITALS — BP 136/84 | HR 75 | Ht 67.0 in | Wt 223.5 lb

## 2013-05-23 DIAGNOSIS — I10 Essential (primary) hypertension: Secondary | ICD-10-CM

## 2013-05-23 DIAGNOSIS — E782 Mixed hyperlipidemia: Secondary | ICD-10-CM

## 2013-05-23 DIAGNOSIS — I2510 Atherosclerotic heart disease of native coronary artery without angina pectoris: Secondary | ICD-10-CM

## 2013-05-23 DIAGNOSIS — IMO0001 Reserved for inherently not codable concepts without codable children: Secondary | ICD-10-CM

## 2013-05-23 DIAGNOSIS — Z23 Encounter for immunization: Secondary | ICD-10-CM

## 2013-05-23 NOTE — Telephone Encounter (Signed)
Patient needs appointment for any refills

## 2013-05-23 NOTE — Assessment & Plan Note (Signed)
Blood pressure controlled. Continue present medications. 

## 2013-05-23 NOTE — Patient Instructions (Addendum)
Schedule Myoview  Follow instructions given     Your physician wants you to follow-up in:14yr will receive a reminder letter in the mail two months in advance. If you don't receive a letter, please call our office to schedule the follow-up appointment.

## 2013-05-23 NOTE — Assessment & Plan Note (Signed)
Continue aspirin and statin. Schedule Myoview for risk stratification. 

## 2013-05-23 NOTE — Assessment & Plan Note (Addendum)
Continue statin. Lipids and liver monitored by primary care. 

## 2013-05-23 NOTE — Progress Notes (Signed)
HPI: Pleasant male for fu of CAD. He underwent cardiac catheterization in 2007 secondary to a myocardial infarction. He was found to have an ejection fraction of 65%. He had a proximal 95% stenosis in his right coronary artery and the distal vessel was occluded. He subsequently underwent PCI of his right coronary artery using 3 drug-eluting stents, 2 of which were overlapping. He had nonobstructive disease in his LAD with a 50% mid and a 40% lesion further downstream. He had a 50% second diagonal as well. A Myoview was performed in October of 2011. At that time, his ejection fraction was 56%. No ischemia or infarction. Last echocardiogram in January of 2008 revealed normal LV function. Since I last saw him in Sept 2013, the patient denies any dyspnea on exertion, orthopnea, PND, pedal edema, palpitations, syncope or chest pain.   Current Outpatient Prescriptions  Medication Sig Dispense Refill  . aspirin 81 MG tablet Take 81 mg by mouth daily.        . carvedilol (COREG) 25 MG tablet Take 1 tablet (25 mg total) by mouth 2 (two) times daily with a meal.  180 tablet  4  . cetirizine (ZYRTEC) 10 MG tablet Take 10 mg by mouth as needed.       . hydrochlorothiazide (HYDRODIURIL) 25 MG tablet Take 1 tablet (25 mg total) by mouth daily.  90 tablet  1  . lisinopril (PRINIVIL,ZESTRIL) 40 MG tablet Take 1 tablet (40 mg total) by mouth daily.  90 tablet  4  . metFORMIN (GLUCOPHAGE) 1000 MG tablet Take 1,000 mg by mouth 2 (two) times daily with a meal.      . niacin (NIASPAN) 500 MG CR tablet TAKE YOUR DAILY ASPIRIN ABOUT 30 MIN PRIOR TO THIS PILL TO DECREASE THEPOTENTIAL FLUSHING EFFECT  30 tablet  5  . simvastatin (ZOCOR) 80 MG tablet Take 1 tablet (80 mg total) by mouth at bedtime. PATIENT NEEDS OFFICE VISIT AND LAB APPOINTMENT.  30 tablet  0   No current facility-administered medications for this visit.     Past Medical History  Diagnosis Date  . Hyperlipidemia   . Hypertension     Mild  hypertensive retinopathy 09/2009  . Coronary atherosclerosis of unspecified type of vessel, native or graft     RCA DES x 3 2007  . Acute myocardial infarction, unspecified site, episode of care unspecified   . Diverticulosis of colon (without mention of hemorrhage)     Admitted 01/2008  . 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  MRI abd 2010 at [facility] showed benign cystic lesion.  Marland Kitchen Unspecified hemorrhoids without mention of complication   . Esophageal reflux     ED visit 10/2009 for globus pharyngeus  . Obesity, unspecified   . Diabetes mellitus     non insulin dependent.   No D.R on exam 09/2009 or 09/01/11.  . Vitamin D deficiency 05/2010    [facility]  . Glaucoma 09/2009    Pressures normal but slight nerve thinning and corneal thickening noted on ophth exam.  . Hypogonadism, male 03/03/2011  . OSA (obstructive sleep apnea) 02/2012    Mild; recommended CPAP titration/trial as of 03/22/12.  . Internal hemorrhoids     Past Surgical History  Procedure Laterality Date  . Cholecystectomy    . Coronary angioplasty with stent placement  2007    DES x 3 to RCA.    Marland Kitchen  Colonoscopy  multiple; most recent 10/28/12    Diverticulosis, moderate internal hemorrhoids.    History   Social History  . Marital Status: Married    Spouse Name: N/A    Number of Children: 2  . Years of Education: N/A   Occupational History  . Buisness owner     real estate   Social History Main Topics  . Smoking status: Former Smoker    Quit date: 08/25/1990  . Smokeless tobacco: Never Used  . Alcohol Use: No  . Drug Use: No  . Sexual Activity: Not on file   Other Topics Concern  . 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    Alcohol Use - no   Daily Caffeine Use -1   Illicit Drug Use - no   Married, 1 son, 1 daughter.  Lives in [place].  Originally from [place] but lived in [place] a while before moving back to [place].             ROS: no fevers or chills, productive cough, hemoptysis, dysphasia, odynophagia, melena, hematochezia, dysuria, hematuria, rash, seizure activity, orthopnea, PND, pedal edema, claudication. Remaining systems are negative.  Physical Exam: Well-developed well-nourished in no acute distress.  Skin is warm and dry.  HEENT is normal.  Neck is supple.  Chest is clear to auscultation with normal expansion.  Cardiovascular exam is regular rate and rhythm.  Abdominal exam nontender or distended. No masses palpated. Extremities show no edema. neuro grossly intact  ECG sinus rhythm, cannot rule out prior inferior infarct.

## 2013-06-02 ENCOUNTER — Encounter: Payer: Self-pay | Admitting: Internal Medicine

## 2013-06-02 ENCOUNTER — Ambulatory Visit (INDEPENDENT_AMBULATORY_CARE_PROVIDER_SITE_OTHER): Payer: BC Managed Care – PPO | Admitting: Internal Medicine

## 2013-06-02 VITALS — BP 112/64 | HR 100 | Temp 97.9°F | Resp 12 | Ht 65.5 in | Wt 219.0 lb

## 2013-06-02 DIAGNOSIS — IMO0001 Reserved for inherently not codable concepts without codable children: Secondary | ICD-10-CM

## 2013-06-02 NOTE — Progress Notes (Signed)
Patient ID: Henry Perry, male   DOB: 1960-12-09, 52 y.o.   MRN: [id number]  HPI: Henry Perry is a 52 y.o.-year-old male, self- referred for management of DM2, non-insulin-dependent, uncontrolled, without complications. New PCP: Dr Carmon Sails in [facility].   Patient has been diagnosed with prediabetes in 2007, diabetes ~2011; he has not been on insulin before.   Last hemoglobin A1c was: 7.8% 05/23/2013. Previously: Component     Latest Ref Rng 01/07/2011 09/11/2011 01/08/2012 07/12/2012  Hemoglobin A1C     4.6 - 6.5 % 7.9 (H) 6.8 (H) 7.5 (H) 7.4 (H)   Pt is on a regimen of: - Metformin 1000 mg po bid - Victoza 1.2 mg - lost 10 lbs with it in last 2.5 weeks.  Was on Welchol.   Pt checks his sugars 1-2 a day and they are: - am: 94-111 - 2h after lunch: 126-148 - 2h after dinner: 126-140s No lows. Lowest sugar was 94; he has hypoglycemia awareness at 85. Highest sugar was 186 in last 2 weeks.   Pt's meals are - diet last 2 weeks: - Breakfast: boiled egg, spinach - Lunch: grilled chicken + veggies - Dinner: grilled fish/chicken + veggies - Snacks: fruits Treadmill: 20 min 2x a day, incline 10 - walk - 3.0 mph.   - no CKD, last BUN/creatinine:  Lab Results  Component Value Date   BUN 10 09/16/2012   CREATININE 1.0 09/16/2012  last ACR 3.5 on 09/11/2011. On lisinopril. - last set of lipids: Lab Results  Component Value Date   CHOL 140 07/12/2012   HDL 33.30* 07/12/2012   LDLCALC 89 07/12/2012   TRIG 89.0 07/12/2012   CHOLHDL 4 07/12/2012  On simvastatin and Niaspan. On ASA 81. - last eye exam was in 09/01/2011. No DR. + Glaucoma. - no numbness and tingling in his feet.  I reviewed his chart and he also has a history of HTN, HL, GERD, OSA, hypogonadism.  No FH of DM, but father with prediabetes.  ROS: Constitutional: + weight loss, no fatigue, no subjective hyperthermia/hypothermia Eyes: no blurry vision, no xerophthalmia ENT: no sore throat, no nodules palpated  in throat, no dysphagia/odynophagia, no hoarseness Cardiovascular: no CP/SOB/palpitations/leg swelling Respiratory: no cough/SOB Gastrointestinal: no N/V/D/C, + heartburn Musculoskeletal: no muscle/joint aches Skin: no rashes Neurological: no tremors/numbness/tingling/dizziness Psychiatric: no depression/anxiety  Past Medical History  Diagnosis Date  . Hyperlipidemia   . 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 Coronary atherosclerosis of unspecified type of vessel, native or graft     RCA DES x 3 2007  . Acute myocardial infarction, unspecified site, episode of care unspecified   . Diverticulosis of colon (without mention of hemorrhage)     Admitted 01/2008  . 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  MRI abd 2010 at [facility] showed benign cystic lesion.  Marland Kitchen Unspecified hemorrhoids without mention of complication   . Esophageal reflux     ED visit 10/2009 for globus pharyngeus  . Obesity, unspecified   . Diabetes mellitus     non insulin dependent.   No D.R on exam 09/2009 or 09/01/11.  . Vitamin D deficiency 05/2010    [facility]  . Glaucoma 09/2009    Pressures normal but slight nerve thinning and corneal thickening noted on ophth exam.  . Hypogonadism, male 03/03/2011  . OSA (obstructive sleep apnea) 02/2012    Mild; recommended  CPAP titration/trial as of 03/22/12.  . Internal hemorrhoids     Past Surgical History  Procedure Laterality Date  . Cholecystectomy    . Coronary angioplasty with stent placement  2007    DES x 3 to RCA.    . Colonoscopy  multiple; most recent 10/28/12    Diverticulosis, moderate internal hemorrhoids.   History   Social History  . Marital Status: Married    Spouse Name: N/A    Number of Children: 2   Occupational History  . Buisness owner     real estate   Social History Main Topics  . Smoking status: Former Smoker    Quit date: 08/25/1990  .  Smokeless tobacco: Never Used  . Alcohol Use: No  . Drug Use: No  . 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    Alcohol Use - no   Daily Caffeine Use -   Illicit Drug Use - no   Married, 1 son, 1 daughter.  Lives in [place].  Originally from [place] but lived in [place] a while before moving back to [place].   Regular exercise: just started   Current Outpatient Prescriptions on File Prior to Visit  Medication Sig Dispense Refill  . aspirin 81 MG tablet Take 81 mg by mouth daily.        . carvedilol (COREG) 25 MG tablet Take 1 tablet (25 mg total) by mouth 2 (two) times daily with a meal.  180 tablet  4  . cetirizine (ZYRTEC) 10 MG tablet Take 10 mg by mouth as needed.       . hydrochlorothiazide (HYDRODIURIL) 25 MG tablet Take 1 tablet (25 mg total) by mouth daily.  90 tablet  1  . lisinopril (PRINIVIL,ZESTRIL) 40 MG tablet Take 1 tablet (40 mg total) by mouth daily.  90 tablet  4  . metFORMIN (GLUCOPHAGE) 1000 MG tablet Take 1,000 mg by mouth 2 (two) times daily with a meal.      . niacin (NIASPAN) 500 MG CR tablet TAKE YOUR DAILY ASPIRIN ABOUT 30 MIN PRIOR TO THIS PILL TO DECREASE THEPOTENTIAL FLUSHING EFFECT  30 tablet  5  . simvastatin (ZOCOR) 80 MG tablet Take 1 tablet (80 mg total) by mouth at bedtime.   30 tablet  0  Also, Victoza, 1.2 mg daily.  No Known Allergies  Family History  Problem Relation Age of Onset  . Prostate cancer Father   . Diabetes Father    PE: BP 112/64  Pulse 100  Temp(Src) 97.9 F (36.6 C) (Oral)  Resp 12  Ht 5' 5.5" (1.664 m)  Wt 219 lb (99.338 kg)  BMI 35.88 kg/m2  SpO2 97% Wt Readings from Last 3 Encounters:  06/02/13 219 lb (99.338 kg)  05/23/13 223 lb 8 oz (101.379 kg)  03/08/13 225 lb (102.059 kg)   Constitutional: overweight, in NAD Eyes: PERRLA, EOMI, no exophthalmos ENT: moist mucous membranes, no thyromegaly, no  cervical lymphadenopathy Cardiovascular: RRR, No MRG Respiratory: CTA B Gastrointestinal: abdomen soft, NT, ND, BS+ Musculoskeletal: no deformities, strength intact in all 4 Skin: moist, warm, no rashes Neurological: no tremor with outstretched hands, DTR normal in all 4  ASSESSMENT: 1. DM2, non-insulin-dependent, uncontrolled, with  Complications - CAD, h/o MI - status post cardiac catheterization in 2007 after MI, status post PCI of the RCA with 3 DESs. Last 2-D  echo 05/2010: Normal EF. - Dr Jens Som Will have a new stress test in 3 weeks.  PLAN:  1. Patient with an ~3-4 year h/o suboptimally controlled diabetes, on metformin at target dose + Victoza. - We discussed about options for treatment, and I suggested to stay on the current regimen and work on his diet and exercise as he is already doing. I made more suggestions regarding healthy diet and gave him a list with healthy substitutions and plant-based materials (see pt instructions) - Strongly advised him to start checking her sugars at different times of the day - check 1-2 times a day, rotating checks - given sugar log and advised how to fill it and to bring it at next appt  - given foot care handout and explained the principles  - given instructions for hypoglycemia management "15-15 rule"  - advised for yearly eye exams - had flu vaccine yesterday - Return to clinic in 3 months with sugar log

## 2013-06-02 NOTE — Patient Instructions (Addendum)
Please return in 3 months with your sugar log.  Check sugars later in the day, too. Please stop at the lab.  PATIENT INSTRUCTIONS FOR TYPE 2 DIABETES:  **Please join MyChart!** - see attached instructions about how to join   DIET AND EXERCISE Diet and exercise is an important part of diabetic treatment.  We recommended aerobic exercise in the form of brisk walking (working between 40-60% of maximal aerobic capacity, similar to brisk walking) for 150 minutes per week (such as 30 minutes five days per week) along with 3 times per week performing 'resistance' training (using various gauge rubber tubes with handles) 5-10 exercises involving the major muscle groups (upper body, lower body and core) performing 10-15 repetitions (or near fatigue) each exercise. Start at half the above goal but build slowly to reach the above goals. If limited by weight, joint pain, or disability, we recommend daily walking in a swimming pool with water up to waist to reduce pressure from joints while allow for adequate exercise.    BLOOD GLUCOSES Monitoring your blood glucoses is important for continued management of your diabetes. Please check your blood glucoses 2-4 times a day: fasting, before meals and at bedtime (you can rotate these measurements - e.g. one day check before the 3 meals, the next day check before 2 of the meals and before bedtime, etc.   HYPOGLYCEMIA (low blood sugar) Hypoglycemia is usually a reaction to not eating, exercising, or taking too much insulin/ other diabetes drugs.  Symptoms include tremors, sweating, hunger, confusion, headache, etc. Treat IMMEDIATELY with 15 grams of Carbs:   4 glucose tablets    cup regular juice/soda   2 tablespoons raisins   4 teaspoons sugar   1 tablespoon honey Recheck blood glucose in 15 mins and repeat above if still symptomatic/blood glucose <100. Please contact our office at [phone] if you have questions about how to next handle your  insulin.  RECOMMENDATIONS TO REDUCE YOUR RISK OF DIABETIC COMPLICATIONS: * Take your prescribed MEDICATION(S). * Follow a DIABETIC diet: Complex carbs, fiber rich foods, heart healthy fish twice weekly, (monounsaturated and polyunsaturated) fats * AVOID saturated/trans fats, high fat foods, >2,300 mg salt per day. * EXERCISE at least 5 times a week for 30 minutes or preferably daily.  * DO NOT SMOKE OR DRINK more than 1 drink a day. * Check your FEET every day. Do not wear tightfitting shoes. Contact us if you develop an ulcer * See your EYE doctor once a year or more if needed * Get a FLU shot once a year * Get a PNEUMONIA vaccine once before and once after age 88 years  GOALS:  * Your Hemoglobin A1c of <7%  * fasting sugars need to be <130 * after meals sugars need to be <180 (2h after you start eating) * Your Systolic BP should be 140 or lower  * Your Diastolic BP should be 80 or lower  * Your HDL (Good Cholesterol) should be 40 or higher  * Your LDL (Bad Cholesterol) should be 100 or lower  * Your Triglycerides should be 150 or lower  * Your Urine microalbumin (kidney function) should be <30 * Your Body Mass Index should be 25 or lower   We will be glad to help you achieve these goals. Our telephone number is: [phone].  Please consider the following ways to cut down carbs and fat and increase fiber and micronutrients in your diet:  - substitute whole grain for white bread or pasta -  substitute brown rice for white rice - substitute 90-calorie flat bread pieces for slices of bread when possible - substitute sweet potatoes or yams for white potatoes - substitute humus for margarine - substitute tofu for cheese when possible - substitute almond or rice milk for regular milk (would not drink soy milk daily due to concern for soy estrogen influence on breast cancer risk) - substitute dark chocolate for other sweets when possible - substitute water - can add lemon or orange  slices for taste - for diet sodas (artificial sweeteners will trick your body that you can eat sweets without getting calories and will lead you to overeating and weight gain in the long run) - do not skip breakfast or other meals (this will slow down the metabolism and will result in more weight gain over time)  - can try smoothies made from fruit and almond/rice milk in am instead of regular breakfast - can also try old-fashioned (not instant) oatmeal made with almond/rice milk in am - order the dressing on the side when eating salad at a restaurant (pour less than half of the dressing on the salad) - eat as little meat as possible - can try juicing, but should not forget that juicing will get rid of the fiber, so would alternate with eating raw veg./fruits or drinking smoothies - use as little oil as possible, even when using olive oil - can dress a salad with a mix of balsamic vinegar and lemon juice, for e.g. - use agave nectar, stevia sugar, or regular sugar rather than artificial sweateners - steam or broil/roast veggies  - snack on veggies/fruit/nuts (unsalted, preferably) when possible, rather than processed foods - reduce or eliminate aspartame in diet (it is in diet sodas, chewing gum, etc) Read the labels!  Try to read Dr. Katherina Right book: "Program for Reversing Diabetes" for the vegan concept and other ideas for healthy eating.  Plant-based diet materials: - Lectures (you tube):  Lequita Asal: "Breaking the Food Seduction"  Doug Lisle: "How to Lose Weight, without Losing Your Mind"  Lucile Crater: "What is Insulin Resistance" [employer] - Documentaries:  Supersize Me  [employer]  Forks over [employer], Sick and Nearly Dead  [employer] the [employer] - Books:  Lequita Asal: "Program for Reversing Diabetes"  Ferol Luz: "The [place] Study"  Konrad Penta: "Supermarket Vegan" (cookbook) - Facebook pages:   Reece Agar  versus Knives  Vegucated  [employer] Magazine  Food Matters - Healthy nutrition info websites:  LateTelevision.com.ee

## 2013-06-14 ENCOUNTER — Encounter: Payer: Self-pay | Admitting: Internal Medicine

## 2013-06-16 ENCOUNTER — Encounter (HOSPITAL_COMMUNITY): Payer: BC Managed Care – PPO

## 2013-06-17 ENCOUNTER — Encounter: Payer: Self-pay | Admitting: Cardiology

## 2013-06-29 ENCOUNTER — Ambulatory Visit (HOSPITAL_COMMUNITY): Payer: BC Managed Care – PPO | Attending: Cardiology | Admitting: Radiology

## 2013-06-29 VITALS — BP 127/84 | HR 88 | Ht 65.0 in | Wt 215.0 lb

## 2013-06-29 DIAGNOSIS — E785 Hyperlipidemia, unspecified: Secondary | ICD-10-CM | POA: Insufficient documentation

## 2013-06-29 DIAGNOSIS — I252 Old myocardial infarction: Secondary | ICD-10-CM | POA: Insufficient documentation

## 2013-06-29 DIAGNOSIS — I2510 Atherosclerotic heart disease of native coronary artery without angina pectoris: Secondary | ICD-10-CM

## 2013-06-29 DIAGNOSIS — E119 Type 2 diabetes mellitus without complications: Secondary | ICD-10-CM | POA: Insufficient documentation

## 2013-06-29 DIAGNOSIS — I10 Essential (primary) hypertension: Secondary | ICD-10-CM | POA: Insufficient documentation

## 2013-06-29 DIAGNOSIS — Z87891 Personal history of nicotine dependence: Secondary | ICD-10-CM | POA: Insufficient documentation

## 2013-06-29 MED ORDER — TECHNETIUM TC 99M SESTAMIBI GENERIC - CARDIOLITE
33.0000 | Freq: Once | INTRAVENOUS | Status: AC | PRN
  Administered 2013-06-29: 33 via INTRAVENOUS

## 2013-06-29 MED ORDER — TECHNETIUM TC 99M SESTAMIBI GENERIC - CARDIOLITE
11.0000 | Freq: Once | INTRAVENOUS | Status: AC | PRN
  Administered 2013-06-29: 11 via INTRAVENOUS

## 2013-06-29 NOTE — Progress Notes (Signed)
[facility] SITE 3 NUCLEAR MED [address] [place], [place] [address] [phone]    Cardiology Nuclear Med Study  Henry Perry is a 52 y.o. male     MRN : [id number]     DOB: 12-26-1960  Procedure Date: 06/29/2013  Nuclear Med Background Indication for Stress Test:  Evaluation for Ischemia and Stent Patency History:  MI 2007, Heart Cath 2007 EF 65%, MPI 2011 EF 56%, Sents X3 RCA Cardiac Risk Factors: History of Smoking, Hypertension, Lipids and NIDDM  Symptoms:  non indicated   Nuclear Pre-Procedure Caffeine/Decaff Intake:  None > 12 hrs NPO After: 8:00pm   Lungs:  clear O2 Sat: 98% on room air. IV 0.9% NS with Angio Cath:  22g  IV Site: R Antecubital x 1, tolerated well IV Started by:  Irean Hong, RN  Chest Size (in):  48 Cup Size: n/a  Height: 5\' 5"  (1.651 m)  Weight:  215 lb (97.523 kg)  BMI:  Body mass index is 35.78 kg/(m^2). Tech Comments:  Held Coreg x 20 hrs. Took Lisinopril and held metformin today. Irean Hong, RN    Nuclear Med Study 1 or 2 day study: 1 day  Stress Test Type:  Stress  Reading MD: Cassell Clement, MD  Order Authorizing Provider:  Olga Millers, MD  Resting Radionuclide: Technetium 1m Sestamibi  Resting Radionuclide Dose: 11.0 mCi   Stress Radionuclide:  Technetium 52m Sestamibi  Stress Radionuclide Dose: 33.0 mCi           Stress Protocol Rest HR: 88 Stress HR: 153  Rest BP: 127/84 Stress BP: 170/75  Exercise Time (min): 8:00 METS: 10.10   Predicted Max HR: 171 bpm % Max HR: 89.47 bpm Rate Pressure Product: 46962   Dose of Adenosine (mg):  n/a Dose of Lexiscan: n/a mg  Dose of Atropine (mg): n/a Dose of Dobutamine: n/a mcg/kg/min (at max HR)  Stress Test Technologist: Nelson Chimes, BS-ES  Nuclear Technologist:  Dario Guardian, CNMT     Rest Procedure:  Myocardial perfusion imaging was performed at rest 45 minutes following the intravenous administration of Technetium 106m Sestamibi. Rest ECG: NSR - Normal  EKG  Stress Procedure:  The patient exercised on the treadmill utilizing the Bruce Protocol for 8:00 minutes. The patient stopped due to fatigue and denied any chest pain.  Technetium 103m Sestamibi was injected at peak exercise and myocardial perfusion imaging was performed after a brief delay. Recovery uneventful.  Stress ECG: No significant change from baseline ECG  QPS Raw Data Images:  Normal; no motion artifact; normal heart/lung ratio. Stress Images:  Normal homogeneous uptake in all areas of the myocardium. Rest Images:  Normal homogeneous uptake in all areas of the myocardium. Subtraction (SDS):  No evidence of ischemia. Transient Ischemic Dilatation (Normal <1.22):  0.87 Lung/Heart Ratio (Normal <0.45):  0.34  Quantitative Gated Spect Images QGS EDV:  80 ml QGS ESV:  36 ml  Impression Exercise Capacity:  Good exercise capacity. BP Response:  Normal blood pressure response. Clinical Symptoms:  No chest pain. ECG Impression:  No significant ST segment change suggestive of ischemia. Comparison with Prior Nuclear Study: No significant change from previous study  Overall Impression:  Normal stress nuclear study.  LV Ejection Fraction: 55%.  LV Wall Motion:  NL LV Function; NL Wall Motion   [employer]

## 2013-07-01 ENCOUNTER — Telehealth: Payer: Self-pay | Admitting: Cardiology

## 2013-07-01 NOTE — Telephone Encounter (Signed)
Spoke with pt, aware of test results. 

## 2013-07-01 NOTE — Telephone Encounter (Signed)
New Problem  Pt calling for stress test results.

## 2013-09-05 ENCOUNTER — Ambulatory Visit: Payer: BC Managed Care – PPO | Admitting: Internal Medicine

## 2013-09-05 DIAGNOSIS — Z0289 Encounter for other administrative examinations: Secondary | ICD-10-CM

## 2013-11-04 ENCOUNTER — Other Ambulatory Visit: Payer: Self-pay | Admitting: Family Medicine

## 2013-11-04 NOTE — Telephone Encounter (Signed)
Medication request has been faxed by [facility].  Patient is now being seen by new PCP, per OV note 06/02/13.   Medication denied.

## 2014-04-21 ENCOUNTER — Telehealth: Payer: Self-pay

## 2014-04-21 NOTE — Telephone Encounter (Signed)
Diabetic Bundle. Requested call back to set appointment up.

## 2014-05-15 ENCOUNTER — Encounter: Payer: 59 | Admitting: Cardiology

## 2014-05-15 NOTE — Progress Notes (Signed)
HPI: FU CAD. He underwent cardiac catheterization in 2007 secondary to a myocardial infarction. He was found to have an ejection fraction of 65%. He had a proximal 95% stenosis in his right coronary artery and the distal vessel was occluded. He subsequently underwent PCI of his right coronary artery using 3 drug-eluting stents, 2 of which were overlapping. He had nonobstructive disease in his LAD with a 50% mid and a 40% lesion further downstream. He had a 50% second diagonal as well. Last echocardiogram in January of 2008 revealed normal LV function. A Myoview was performed in Nov 2014. At that time, his ejection fraction was 55%. No ischemia or infarction. Since I last saw him    Current Outpatient Prescriptions  Medication Sig Dispense Refill  . aspirin 81 MG tablet Take 81 mg by mouth daily.        . carvedilol (COREG) 25 MG tablet Take 1 tablet (25 mg total) by mouth 2 (two) times daily with a meal.  180 tablet  4  . cetirizine (ZYRTEC) 10 MG tablet Take 10 mg by mouth as needed.       . hydrochlorothiazide (HYDRODIURIL) 25 MG tablet Take 1 tablet (25 mg total) by mouth daily.  90 tablet  1  . lisinopril (PRINIVIL,ZESTRIL) 40 MG tablet Take 1 tablet (40 mg total) by mouth daily.  90 tablet  4  . metFORMIN (GLUCOPHAGE) 1000 MG tablet Take 1,000 mg by mouth 2 (two) times daily with a meal.      . niacin (NIASPAN) 500 MG CR tablet TAKE YOUR DAILY ASPIRIN ABOUT 30 MIN PRIOR TO THIS PILL TO DECREASE THEPOTENTIAL FLUSHING EFFECT  30 tablet  5  . simvastatin (ZOCOR) 80 MG tablet Take 1 tablet (80 mg total) by mouth at bedtime. PATIENT NEEDS OFFICE VISIT AND LAB APPOINTMENT.  30 tablet  0  . VICTOZA 18 MG/3ML SOPN Inject 1.2 mg into the muscle every morning.        No current facility-administered medications for this visit.     Past Medical History  Diagnosis Date  . Hyperlipidemia   . 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 Coronary atherosclerosis of unspecified  type of vessel, native or graft     RCA DES x 3 2007  . Acute myocardial infarction, unspecified site, episode of care unspecified   . Diverticulosis of colon (without mention of hemorrhage)     Admitted 01/2008  . 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  [facility] abd 2010 at [facility] showed benign cystic lesion.  Marland Kitchen Unspecified hemorrhoids without mention of complication   . Esophageal reflux     ED visit 10/2009 for globus pharyngeus  . Obesity, unspecified   . Diabetes mellitus     non insulin dependent.   No D.R on exam 09/2009 or 09/01/11.  . Vitamin D deficiency 05/2010    [facility]  . Glaucoma 09/2009    Pressures normal but slight nerve thinning and corneal thickening noted on ophth exam.  . Hypogonadism, male 03/03/2011  . OSA (obstructive sleep apnea) 02/2012    Mild; recommended CPAP titration/trial as of 03/22/12.  . Internal hemorrhoids     Past Surgical History  Procedure Laterality Date  . Cholecystectomy    . Coronary angioplasty with stent placement  2007    DES x 3 to RCA.    Marland Kitchen  Colonoscopy  multiple; most recent 10/28/12    Diverticulosis, moderate internal hemorrhoids.    History   Social History  . Marital Status: Married    Spouse Name: N/A    Number of Children: 2  . Years of Education: N/A   Occupational History  . Buisness owner     real estate   Social History Main Topics  . Smoking status: Former Smoker    Quit date: 08/25/1990  . Smokeless tobacco: Never Used  . Alcohol Use: No  . Drug Use: No  . Sexual Activity: Yes    Partners: Female   Other Topics Concern  . 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    Alcohol Use - no   Daily Caffeine Use -   Illicit Drug Use - no   Married, 1 son, 1 daughter.  Lives in [place].  Originally from [place] but lived in [place] a while before  moving back to [place].   Regular exercise: just started                   ROS: no fevers or chills, productive cough, hemoptysis, dysphasia, odynophagia, melena, hematochezia, dysuria, hematuria, rash, seizure activity, orthopnea, PND, pedal edema, claudication. Remaining systems are negative.  Physical Exam: Well-developed well-nourished in no acute distress.  Skin is warm and dry.  HEENT is normal.  Neck is supple.  Chest is clear to auscultation with normal expansion.  Cardiovascular exam is regular rate and rhythm.  Abdominal exam nontender or distended. No masses palpated. Extremities show no edema. neuro grossly intact  ECG     This encounter was created in error - please disregard.

## 2014-06-15 NOTE — Progress Notes (Signed)
HPI: FU CAD. He underwent cardiac catheterization in 2007 secondary to a myocardial infarction. He was found to have an ejection fraction of 65%. He had a proximal 95% stenosis in his right coronary artery and the distal vessel was occluded. He subsequently underwent PCI of his right coronary artery using 3 drug-eluting stents, 2 of which were overlapping. He had nonobstructive disease in his LAD with a 50% mid and a 40% lesion further downstream. He had a 50% second diagonal as well. Last echocardiogram in January of 2008 revealed normal LV function. A Myoview was performed in Nov 2014. At that time, his ejection fraction was 55%. No ischemia or infarction. Since I last saw him, There is no dyspnea, palpitations or syncope. He occasionally has an indigestion feeling after eating. No exertional symptoms.    Current Outpatient Prescriptions  Medication Sig Dispense Refill  . aspirin 81 MG tablet Take 81 mg by mouth daily.        . carvedilol (COREG) 25 MG tablet Take 1 tablet (25 mg total) by mouth 2 (two) times daily with a meal.  180 tablet  4  . cetirizine (ZYRTEC) 10 MG tablet Take 10 mg by mouth as needed.       . hydrochlorothiazide (HYDRODIURIL) 25 MG tablet Take 1 tablet (25 mg total) by mouth daily.  90 tablet  1  . lisinopril (PRINIVIL,ZESTRIL) 40 MG tablet Take 1 tablet (40 mg total) by mouth daily.  90 tablet  4  . metFORMIN (GLUCOPHAGE) 1000 MG tablet Take 1,000 mg by mouth 2 (two) times daily with a meal.      . niacin (NIASPAN) 500 MG CR tablet TAKE YOUR DAILY ASPIRIN ABOUT 30 MIN PRIOR TO THIS PILL TO DECREASE THEPOTENTIAL FLUSHING EFFECT  30 tablet  5  . simvastatin (ZOCOR) 80 MG tablet Take 1 tablet (80 mg total) by mouth at bedtime. PATIENT NEEDS OFFICE VISIT AND LAB APPOINTMENT.  30 tablet  0  . VICTOZA 18 MG/3ML SOPN Inject 1.2 mg into the muscle every morning.        No current facility-administered medications for this visit.     Past Medical History  Diagnosis  Date  . Hyperlipidemia   . 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 Coronary atherosclerosis of unspecified type of vessel, native or graft     RCA DES x 3 2007  . Acute myocardial infarction, unspecified site, episode of care unspecified   . Diverticulosis of colon (without mention of hemorrhage)     Admitted 01/2008  . 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  MRI abd 2010 at [facility] showed benign cystic lesion.  Marland Kitchen Unspecified hemorrhoids without mention of complication   . Esophageal reflux     ED visit 10/2009 for globus pharyngeus  . Obesity, unspecified   . Diabetes mellitus     non insulin dependent.   No D.R on exam 09/2009 or 09/01/11.  . Vitamin D deficiency 05/2010    [facility]  . Glaucoma 09/2009    Pressures normal but slight nerve thinning and corneal thickening noted on ophth exam.  . Hypogonadism, male 03/03/2011  . OSA (obstructive sleep apnea) 02/2012    Mild; recommended CPAP titration/trial as of 03/22/12.  . Internal hemorrhoids     Past Surgical History  Procedure Laterality Date  . Cholecystectomy    . Coronary  angioplasty with stent placement  2007    DES x 3 to RCA.    . Colonoscopy  multiple; most recent 10/28/12    Diverticulosis, moderate internal hemorrhoids.    History   Social History  . Marital Status: Married    Spouse Name: N/A    Number of Children: 2  . Years of Education: N/A   Occupational History  . Buisness owner     real estate   Social History Main Topics  . Smoking status: Former Smoker    Quit date: 08/25/1990  . Smokeless tobacco: Never Used  . Alcohol Use: No  . Drug Use: No  . Sexual Activity: Yes    Partners: Female   Other Topics Concern  . 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    Alcohol Use - no   Daily Caffeine Use -    Illicit Drug Use - no   Married, 1 son, 1 daughter.  Lives in [place].  Originally from [place] but lived in [place] a while before moving back to [place].   Regular exercise: just started                   ROS: no fevers or chills, productive cough, hemoptysis, dysphasia, odynophagia, melena, hematochezia, dysuria, hematuria, rash, seizure activity, orthopnea, PND, pedal edema, claudication. Remaining systems are negative.  Physical Exam: Well-developed well-nourished in no acute distress.  Skin is warm and dry.  HEENT is normal.  Neck is supple.  Chest is clear to auscultation with normal expansion.  Cardiovascular exam is regular rate and rhythm.  Abdominal exam nontender or distended. No masses palpated. Extremities show no edema. neuro grossly intact  ECG Normal sinus rhythm at a rate of 67. No ST changes.

## 2014-06-16 ENCOUNTER — Ambulatory Visit (INDEPENDENT_AMBULATORY_CARE_PROVIDER_SITE_OTHER): Payer: 59 | Admitting: Cardiology

## 2014-06-16 ENCOUNTER — Encounter: Payer: Self-pay | Admitting: Cardiology

## 2014-06-16 VITALS — BP 130/90 | HR 67 | Ht 66.0 in | Wt 226.2 lb

## 2014-06-16 DIAGNOSIS — R0789 Other chest pain: Secondary | ICD-10-CM | POA: Insufficient documentation

## 2014-06-16 DIAGNOSIS — R072 Precordial pain: Secondary | ICD-10-CM

## 2014-06-16 DIAGNOSIS — I2510 Atherosclerotic heart disease of native coronary artery without angina pectoris: Secondary | ICD-10-CM

## 2014-06-16 DIAGNOSIS — I10 Essential (primary) hypertension: Secondary | ICD-10-CM

## 2014-06-16 MED ORDER — TADALAFIL 10 MG PO TABS: 10.0000 mg | ORAL_TABLET | Freq: Every day | ORAL | Status: DC | PRN

## 2014-06-16 NOTE — Patient Instructions (Addendum)
Your physician wants you to follow-up in: [place] will receive a reminder letter in the mail two months in advance. If you don't receive a letter, please call our office to schedule the follow-up appointment.   Your physician has requested that you have an exercise tolerance test. For further information please visit [email or web address]. Please also follow instruction sheet, as given.   START CIALIS 10 MG AS NEEDED   Exercise Stress Electrocardiogram An exercise stress electrocardiogram is a test to check how blood flows to your heart. It is done to find areas of poor blood flow. You will need to walk on a treadmill for this test. The electrocardiogram will record your heartbeat when you are at rest and when you are exercising. BEFORE THE PROCEDURE  Do not have drinks with caffeine or foods with caffeine for 24 hours before the test, or as told by your doctor. This includes coffee, tea (even decaf tea), sodas, chocolate, and cocoa.  Follow your doctor's instructions about eating and drinking before the test.  Ask your doctor what medicines you should or should not take before the test. Take your medicines with water unless told by your doctor not to.  If you use an inhaler, bring it with you to the test.  Bring a snack to eat after the test.  Do not  smoke for 4 hours before the test.  Do not put lotions, powders, creams, or oils on your chest before the test.  Wear comfortable shoes and clothing. PROCEDURE  You will have patches put on your chest. Small areas of your chest may need to be shaved. Wires will be connected to the patches.  Your heart rate will be watched while you are resting and while you are exercising.  You will walk on the treadmill. The treadmill will slowly get faster to raise your heart rate.  The test will take about 1-2 hours. AFTER THE PROCEDURE  Your heart rate and blood pressure will be watched after the test.  You may return to  your normal diet, activities, and medicines or as told by your doctor. Document Released: 01/28/2008 Document Revised: 12/26/2013 Document Reviewed: 04/18/2013 [facility] Patient Information 2015 [place], [place]. This information is not intended to replace advice given to you by your health care provider. Make sure you discuss any questions you have with your health care provider.

## 2014-06-16 NOTE — Assessment & Plan Note (Signed)
Symptoms sound most likely GI related. I will arrange an exercise treadmill for risk stratification.

## 2014-06-16 NOTE — Assessment & Plan Note (Signed)
Continue statin. Lipids and liver monitored by primary care. 

## 2014-06-16 NOTE — Assessment & Plan Note (Signed)
Patient monitors his blood pressure at home and it is typically controlled. Continue present medications. Potassium and renal function monitored by primary care.

## 2014-06-16 NOTE — Assessment & Plan Note (Signed)
Continue aspirin and statin. 

## 2014-06-23 ENCOUNTER — Telehealth (HOSPITAL_COMMUNITY): Payer: Self-pay

## 2014-06-23 NOTE — Telephone Encounter (Signed)
Encounter complete. 

## 2014-06-27 ENCOUNTER — Telehealth (HOSPITAL_COMMUNITY): Payer: Self-pay

## 2014-06-27 NOTE — Telephone Encounter (Signed)
Encounter complete. 

## 2014-06-28 ENCOUNTER — Ambulatory Visit (HOSPITAL_COMMUNITY)
Admission: RE | Admit: 2014-06-28 | Discharge: 2014-06-28 | Disposition: A | Discharge status: Home / Self Care | Payer: 59 | Source: Ambulatory Visit | Attending: Cardiovascular Disease | Admitting: Cardiovascular Disease

## 2014-06-28 ENCOUNTER — Encounter: Payer: Self-pay | Admitting: Cardiology

## 2014-06-28 DIAGNOSIS — R079 Chest pain, unspecified: Secondary | ICD-10-CM | POA: Insufficient documentation

## 2014-06-28 DIAGNOSIS — I2510 Atherosclerotic heart disease of native coronary artery without angina pectoris: Secondary | ICD-10-CM | POA: Insufficient documentation

## 2014-06-28 NOTE — Procedures (Signed)
  Test  Exercise Tolerance Test Ordering MD: Kirk Ruths, MD    Unique Test No: 1  Treadmill:  1  Indication for ETT: chest pain - rule out ischemia  Contraindication to ETT: No   Stress Modality: exercise - treadmill  Cardiac Imaging Performed: non   Protocol: standard Bruce - maximal  Max BP:  200/100  Max MPHR (bpm):  170 85% MPR (bpm):  144  MPHR obtained (bpm):  169 % MPHR obtained:  99  Reached 85% MPHR (min:sec):  8:42 Total Exercise Time (min-sec):  10:30  Workload in METS:  12.5 Borg Scale: 15  Reason ETT Terminated:  Fatigue & SOB    ST Segment Analysis At Rest: normal ST segments - no evidence of significant ST depression With Exercise: no evidence of significant ST depression  Other Information Arrhythmia:  No Angina during ETT:  absent (0) Quality of ETT:  diagnostic  ETT Interpretation:  normal - no evidence of ischemia by ST analysis  Comments: The patient had an excellent exercise tolerance.  There was no chest pain.  There was an appropriate level of dyspnea.  There were no arrhythmias, a normal heart rate response and an elevated BP response.  There were no ischemic ST T wave changes and a normal heart rate recovery.  The Duke treadmill score of 12 represented a low risk.    Recommendations: Negative adequate POET (Plain Old Exercise Treadmill) with an accelerated BP response.  Further evaluation per Dr. Stanford Breed.

## 2014-06-28 NOTE — Telephone Encounter (Signed)
Returning your call. °

## 2014-06-28 NOTE — Telephone Encounter (Signed)
This encounter was created in error - please disregard.

## 2014-06-29 ENCOUNTER — Telehealth: Payer: Self-pay | Admitting: Cardiology

## 2014-06-29 NOTE — Telephone Encounter (Signed)
Pt found out wife already had his results.

## 2014-11-03 LAB — HM DIABETES EYE EXAM

## 2014-11-09 ENCOUNTER — Encounter: Payer: Self-pay | Admitting: Internal Medicine

## 2014-11-09 ENCOUNTER — Ambulatory Visit (INDEPENDENT_AMBULATORY_CARE_PROVIDER_SITE_OTHER): Payer: 59 | Admitting: Internal Medicine

## 2014-11-09 VITALS — BP 124/62 | HR 99 | Temp 97.7°F | Resp 12 | Wt 218.8 lb

## 2014-11-09 DIAGNOSIS — E1159 Type 2 diabetes mellitus with other circulatory complications: Secondary | ICD-10-CM

## 2014-11-09 DIAGNOSIS — I2510 Atherosclerotic heart disease of native coronary artery without angina pectoris: Secondary | ICD-10-CM | POA: Insufficient documentation

## 2014-11-09 NOTE — Progress Notes (Addendum)
Patient ID: Henry Perry, male   DOB: 12-03-1960, 54 y.o.   MRN: [id number]  HPI: Henry Perry is a 54 y.o.-year-old male, referred by his PCP, Dr. Chapman Fitch, for management of DM2, prediabetes in 2007, diabetes ~2011; non-insulin-dependent, uncontrolled, with complications (CAD, status post stent placement). Last visit 05/2013. He is here with his wife who offers part of the hx.  Last hemoglobin A1c was: 10/19/2014: 9.5% 09/13/2013: 6.4% 05/20/2013: 7.8% Lab Results  Component Value Date   HGBA1C 7.4* 07/12/2012   HGBA1C 7.5* 01/08/2012   HGBA1C 6.8* 09/11/2011   01/2008: 8%   Pt is on a regimen of: - Metformin 1000 mg 2x a day, with meals - Tanzeum 30 mg weekly >> switched from Victoza as he developed nausea (but was off Victoza for 4 mo) hE WAS ON wELCHOL IN THE PAST.  Pt checks his sugars 2-3x a day and they are: - am: n/c >> 100-104 - 2h after b'fast: n/c  - before lunch: n/c - 2h after lunch: n/c >> 140-160  - 3-4 pm: 112-130  - before dinner: n/c - 2h after dinner: n/c - bedtime: 112-130 - nighttime: n/c No lows. Lowest sugar was 100; he has hypoglycemia awareness at 70.  Highest sugar was 170.  Glucometer: True Test  Pt's meals are: - Breakfast: smoothie with coconut water + berries - Lunch: carrots + kale + chicken - Dinner: fish + salad + veggies - Snacks: fruit; yoghurt  He eliminated carbs and is now on a mediterranean diet  - no CKD, last BUN/creatinine:  Lab Results  Component Value Date   BUN 10 09/16/2012   CREATININE 1.0 09/16/2012  He is on lisinopril 40 mg daily. - last set of lipids: Lab Results  Component Value Date   CHOL 140 07/12/2012   HDL 33.30* 07/12/2012   LDLCALC 89 07/12/2012   TRIG 89.0 07/12/2012   CHOLHDL 4 07/12/2012  He is on simvastatin 80 mg daily and niacin 500 mg daily - last eye exam was in 11/03/2014. No DR. + glaucoma. - no numbness and tingling in his feet.  Pt has FH of DM in father.  ROS: Constitutional: + weight  loss, no fatigue, no subjective hyperthermia/hypothermia Eyes: no blurry vision, no xerophthalmia ENT: no sore throat, no nodules palpated in throat, no dysphagia/odynophagia, no hoarseness Cardiovascular: no CP/SOB/palpitations/leg swelling Respiratory: no cough/SOB Gastrointestinal: no N/V/D/C Musculoskeletal: no muscle/joint aches Skin: no rashes Neurological: no tremors/numbness/tingling/dizziness Psychiatric: no depression/anxiety  Past Medical History  Diagnosis Date  . Hyperlipidemia   . 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 Coronary atherosclerosis of unspecified type of vessel, native or graft     RCA DES x 3 2007  . Acute myocardial infarction, unspecified site, episode of care unspecified   . Diverticulosis of colon (without mention of hemorrhage)     Admitted 01/2008  . 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  MRI abd 2010 at [facility] showed benign cystic lesion.  Marland Kitchen Unspecified hemorrhoids without mention of complication   . Esophageal reflux     ED visit 10/2009 for globus pharyngeus  . Obesity, unspecified   . Diabetes mellitus     non insulin dependent.   No D.R on exam 09/2009 or 09/01/11.  . Vitamin D deficiency 05/2010    [facility]  . Glaucoma 09/2009    Pressures normal but slight nerve thinning and corneal  thickening noted on ophth exam.  . Hypogonadism, male 03/03/2011  . OSA (obstructive sleep apnea) 02/2012    Mild; recommended CPAP titration/trial as of 03/22/12.  . Internal hemorrhoids    Past Surgical History  Procedure Laterality Date  . Cholecystectomy    . Coronary angioplasty with stent placement  2007    DES x 3 to RCA.    . Colonoscopy  multiple; most recent 10/28/12    Diverticulosis, moderate internal hemorrhoids.   History   Social History  . Marital Status: Married    Spouse Name: N/A  . Number of Children: 2  . Years of Education: N/A    Occupational History  . Buisness owner     real estate   Social History Main Topics  . Smoking status: Former Smoker    Quit date: 08/25/1990  . Smokeless tobacco: Never Used  . Alcohol Use: No  . Drug Use: No  . 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    Alcohol Use - no   Daily Caffeine Use -   Illicit Drug Use - no   Married, 1 son, 1 daughter.  Lives in [place].  Originally from [place] but lived in [place] a while before moving back to [place].   Regular exercise: just started   Current Outpatient Prescriptions on File Prior to Visit  Medication Sig Dispense Refill  . aspirin 81 MG tablet Take 81 mg by mouth daily.      . carvedilol (COREG) 25 MG tablet Take 1 tablet (25 mg total) by mouth 2 (two) times daily with a meal. 180 tablet 4  . cetirizine (ZYRTEC) 10 MG tablet Take 10 mg by mouth as needed.     . hydrochlorothiazide (HYDRODIURIL) 25 MG tablet Take 1 tablet (25 mg total) by mouth daily. 90 tablet 1  . lisinopril (PRINIVIL,ZESTRIL) 40 MG tablet Take 1 tablet (40 mg total) by mouth daily. 90 tablet 4  . metFORMIN (GLUCOPHAGE) 1000 MG tablet Take 1,000 mg by mouth 2 (two) times daily with a meal.    . niacin (NIASPAN) 500 MG CR tablet TAKE YOUR DAILY ASPIRIN ABOUT 30 MIN PRIOR TO THIS PILL TO DECREASE THEPOTENTIAL FLUSHING EFFECT 30 tablet 5  . simvastatin (ZOCOR) 80 MG tablet Take 1 tablet (80 mg total) by mouth at bedtime. PATIENT NEEDS OFFICE VISIT AND LAB APPOINTMENT. 30 tablet 0  . tadalafil (CIALIS) 10 MG tablet Take 1 tablet (10 mg total) by mouth daily as needed for erectile dysfunction. 10 tablet 6   No current facility-administered medications on file prior to visit.   No Known Allergies Family History  Problem Relation Age of Onset  . Prostate cancer Father   . Diabetes Father    PE: BP 124/62 mmHg  Pulse 99  Temp(Src) 97.7 F (36.5 C)  (Oral)  Resp 12  Wt 218 lb 12.8 oz (99.247 kg)  SpO2 96% Body mass index is 35.33 kg/(m^2).  Wt Readings from Last 3 Encounters:  11/09/14 218 lb 12.8 oz (99.247 kg)  06/16/14 226 lb 3.2 oz (102.604 kg)  06/29/13 215 lb (97.523 kg)   Constitutional: overweight, in NAD Eyes: PERRLA, EOMI, no exophthalmos ENT: moist mucous membranes, no thyromegaly, no cervical lymphadenopathy Cardiovascular: RRR, No MRG Respiratory: CTA B Gastrointestinal: abdomen soft, NT, ND, BS+ Musculoskeletal: no deformities, strength intact in all 4 Skin: moist, warm,  no rashes Neurological: no tremor with outstretched hands, DTR normal in all 4  ASSESSMENT: 1. DM2, non-insulin-dependent, uncontrolled, with complications  - CAD  PLAN:  1. Patient with uncontrolled diabetes, on oral antidiabetic regimen + Tanzeum, with a high last HbA1c. He, however, started to change his diet and also started exercising after last HbA1c and his sugars are very good. Will cont. Same regimen. His goal is to ultimately come off his meds.  - We discussed about options for treatment, and I suggested to:  Patient Instructions  Continue Metformin 1000 mg 2x a day. Continue Tanzeum 30 mg weekly.  KEEP UP THE GREAT WORK!  Please return in 2.5 months with your sugar log.  - we discussed at length about diet >> made suggestions, given them the name of a vegan cookbook - Strongly advised him to start checking sugars at different times of the day - check 1-2 times a day, rotating checks - given a sugar log and advised how to fill it and to bring it at next appt  - given foot care handout and explained the principles  - given instructions for hypoglycemia management "15-15 rule"  - advised for yearly eye exams >> he is UTD - Return to clinic in 2.5 mo with sugar log >> will check HbA1c then  - time spent with the patient: 45 min, of which >50% was spent in obtaining information about his ds, reviewing his previous labs, evaluations,  and treatments, counseling him about his diabetes and nutrition (please see the discussed topics above). He an his wife had a number of questions which I addressed.  11/28/2014 Received records from PCP: (11/01/2014) - Lipid panel: 153/217/33/77 - AST 26 (10/27/2014) - CMP normal, except: Glu 255. BUN/Cr 9/0.98 - ACR  ND - TSH 1.05

## 2014-11-09 NOTE — Patient Instructions (Signed)
Continue Metformin 1000 mg 2x a day. Continue Tanzeum 30 mg weekly.  KEEP UP THE GREAT WORK!  Please return in 2.5 monthS with your sugar log.

## 2015-01-09 ENCOUNTER — Ambulatory Visit: Payer: 59 | Admitting: Internal Medicine

## 2015-01-30 ENCOUNTER — Encounter: Payer: Self-pay | Admitting: Internal Medicine

## 2015-01-30 ENCOUNTER — Ambulatory Visit (INDEPENDENT_AMBULATORY_CARE_PROVIDER_SITE_OTHER): Payer: 59 | Admitting: Internal Medicine

## 2015-01-30 VITALS — BP 102/64 | HR 83 | Temp 98.7°F | Resp 12 | Wt 223.0 lb

## 2015-01-30 DIAGNOSIS — E1159 Type 2 diabetes mellitus with other circulatory complications: Secondary | ICD-10-CM | POA: Diagnosis not present

## 2015-01-30 NOTE — Patient Instructions (Signed)
Please continue Metformin 1000 mg 2x a day. Continue Tanzeum 30 mg weekly.  Please stop at the lab.  Please return in 3 months with your sugar log.

## 2015-01-30 NOTE — Progress Notes (Signed)
Patient ID: Henry Perry, male   DOB: 1961/02/13, 54 y.o.   MRN: [id number]  HPI: Henry Perry is a 54 y.o.-year-old male, referred by his PCP, Dr. Chapman Fitch, for management of DM2, prediabetes in 2007, diabetes ~2011; non-insulin-dependent, uncontrolled, with complications (CAD, status post stent placement). Last visit 3 mo ago.   Last hemoglobin A1c was: 10/19/2014: 9.5% 09/13/2013: 6.4% 05/20/2013: 7.8% Lab Results  Component Value Date   HGBA1C 7.4* 07/12/2012   HGBA1C 7.5* 01/08/2012   HGBA1C 6.8* 09/11/2011   01/2008: 8%   Pt is on a regimen of: - Metformin 1000 mg 2x a day, with meals - Tanzeum 30 mg weekly >> switched from Victoza as he developed nausea (but was off Victoza for 4 mo) He was on Welchol in the past.  Pt not checking sugars - stopped as his meter was giving him errors; before this 100-130. Reviewed sugars from last time: - am: n/c >> 100-104  - 2h after b'fast: n/c  - before lunch: n/c - 2h after lunch: n/c >> 140-160  - 3-4 pm: 112-130  - before dinner: n/c - 2h after dinner: n/c - bedtime: 112-130 - nighttime: n/c No lows. Lowest sugar was 100 >> 101; he has hypoglycemia awareness at 70.  Highest sugar was 170 >> 156.  Glucometer: True Test  Pt's meals are: - Breakfast: smoothie with coconut water + berries - Lunch: carrots + kale + chicken - Dinner: fish + salad + veggies - Snacks: fruit; yoghurt  He eliminated carbs and is now on a mediterranean diet  Received records from PCP: 10/2014: - AST 26 - ACR  ND - TSH 1.05  - no CKD: 10/27/2014: CMP normal, except: Glu 255. BUN/Cr 9/0.98 09/13/2013: 10/1.1 Lab Results  Component Value Date   BUN 10 09/16/2012   CREATININE 1.0 09/16/2012  He is on lisinopril 40 mg daily. - last set of lipids: 11/01/2014: 153/217/33/77 Lab Results  Component Value Date   CHOL 140 07/12/2012   HDL 33.30* 07/12/2012   LDLCALC 89 07/12/2012   TRIG 89.0 07/12/2012   CHOLHDL 4 07/12/2012  He is on simvastatin  80 mg daily and niacin 500 mg daily - last eye exam was in 11/03/2014. No DR. + glaucoma. - no numbness and tingling in his feet.  ROS: Constitutional: no weight loss/gain, no fatigue, no subjective hyperthermia/hypothermia, + nocturia Eyes: no blurry vision, no xerophthalmia ENT: no sore throat, no nodules palpated in throat, no dysphagia/odynophagia, no hoarseness Cardiovascular: no CP/SOB/palpitations/leg swelling Respiratory: no cough/SOB Gastrointestinal: + occasional N/no V/D/C Musculoskeletal: no muscle/joint aches Skin: no rashes Neurological: no tremors/numbness/tingling/dizziness  I reviewed pt's medications, allergies, PMH, social hx, family hx, and changes were documented in the history of present illness. Otherwise, unchanged from my initial visit note.  Past Medical History  Diagnosis Date  . Hyperlipidemia   . 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 Coronary atherosclerosis of unspecified type of vessel, native or graft     RCA DES x 3 2007  . Acute myocardial infarction, unspecified site, episode of care unspecified   . Diverticulosis of colon (without mention of hemorrhage)     Admitted 01/2008  . 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  MRI abd 2010 at [facility] showed benign cystic lesion.  Marland Kitchen Unspecified hemorrhoids without mention of complication   . Esophageal reflux     ED visit 10/2009 for globus pharyngeus  . Obesity, unspecified   .  Diabetes mellitus     non insulin dependent.   No D.R on exam 09/2009 or 09/01/11.  . Vitamin D deficiency 05/2010    [facility]  . Glaucoma 09/2009    Pressures normal but slight nerve thinning and corneal thickening noted on ophth exam.  . Hypogonadism, male 03/03/2011  . OSA (obstructive sleep apnea) 02/2012    Mild; recommended CPAP titration/trial as of 03/22/12.  . Internal hemorrhoids    Past Surgical History  Procedure  Laterality Date  . Cholecystectomy    . Coronary angioplasty with stent placement  2007    DES x 3 to RCA.    . Colonoscopy  multiple; most recent 10/28/12    Diverticulosis, moderate internal hemorrhoids.   History   Social History  . Marital Status: Married    Spouse Name: N/A  . Number of Children: 2  . Years of Education: N/A   Occupational History  . Buisness owner     real estate   Social History Main Topics  . Smoking status: Former Smoker    Quit date: 08/25/1990  . Smokeless tobacco: Never Used  . Alcohol Use: No  . Drug Use: No  . 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    Alcohol Use - no   Daily Caffeine Use -   Illicit Drug Use - no   Married, 1 son, 1 daughter.  Lives in [place].  Originally from [place] but lived in [place] a while before moving back to [place].   Regular exercise: just started   Current Outpatient Prescriptions on File Prior to Visit  Medication Sig Dispense Refill  . aspirin 81 MG tablet Take 81 mg by mouth daily.      . carvedilol (COREG) 25 MG tablet Take 1 tablet (25 mg total) by mouth 2 (two) times daily with a meal. 180 tablet 4  . cetirizine (ZYRTEC) 10 MG tablet Take 10 mg by mouth as needed.     . hydrochlorothiazide (HYDRODIURIL) 25 MG tablet Take 1 tablet (25 mg total) by mouth daily. 90 tablet 1  . lisinopril (PRINIVIL,ZESTRIL) 40 MG tablet Take 1 tablet (40 mg total) by mouth daily. 90 tablet 4  . metFORMIN (GLUCOPHAGE) 1000 MG tablet Take 1,000 mg by mouth 2 (two) times daily with a meal.    . niacin (NIASPAN) 500 MG CR tablet TAKE YOUR DAILY ASPIRIN ABOUT 30 MIN PRIOR TO THIS PILL TO DECREASE THEPOTENTIAL FLUSHING EFFECT 30 tablet 5  . simvastatin (ZOCOR) 80 MG tablet Take 1 tablet (80 mg total) by mouth at bedtime. PATIENT NEEDS OFFICE VISIT AND LAB APPOINTMENT. 30 tablet 0  . tadalafil (CIALIS) 10 MG tablet Take 1  tablet (10 mg total) by mouth daily as needed for erectile dysfunction. 10 tablet 6  . TANZEUM 30 MG PEN      No current facility-administered medications on file prior to visit.   No Known Allergies Family History  Problem Relation Age of Onset  . Prostate cancer Father   . Diabetes Father    PE: BP 102/64 mmHg  Pulse 83  Temp(Src) 98.7 F (37.1 C) (Oral)  Resp 12  Wt 223 lb (101.152 kg)  SpO2 95% Body mass index is 36.01 kg/(m^2).  Wt Readings from Last 3 Encounters:  01/30/15 223 lb (101.152 kg)  11/09/14 218  lb 12.8 oz (99.247 kg)  06/16/14 226 lb 3.2 oz (102.604 kg)   Constitutional: overweight, in NAD Eyes: PERRLA, EOMI, no exophthalmos ENT: moist mucous membranes, no thyromegaly, no cervical lymphadenopathy Cardiovascular: RRR, No MRG Respiratory: CTA B Gastrointestinal: abdomen soft, NT, ND, BS+ Musculoskeletal: no deformities, strength intact in all 4 Skin: moist, warm, no rashes Neurological: no tremor with outstretched hands, DTR normal in all 4  ASSESSMENT: 1. DM2, non-insulin-dependent, uncontrolled, with complications  - CAD  PLAN:  1. Patient with uncontrolled diabetes, on oral antidiabetic regimen + Tanzeum, with a high previous HbA1c after he came off his meds. Now sugars back in control.  - We discussed about options for treatment, and I suggested to:  Patient Instructions  Please continue Metformin 1000 mg 2x a day. Continue Tanzeum 30 mg weekly.  Please stop at the lab.  Please return in 3 months with your sugar log.  - continue checking sugars at different times of the day - check 1-2 times a day, rotating checks - advised for yearly eye exams >> he is UTD - will check HbA1c now - Return to clinic in 3 mo with sugar log   02/03/2015 HbA1c still pending... Will check with the lab.

## 2015-02-09 ENCOUNTER — Telehealth: Payer: Self-pay | Admitting: *Deleted

## 2015-02-09 DIAGNOSIS — E1159 Type 2 diabetes mellitus with other circulatory complications: Secondary | ICD-10-CM

## 2015-02-09 NOTE — Telephone Encounter (Signed)
Called pt and lvm advising him that lab (lost) did not receive the vial and would need to redraw the A1c. Advised pt to call and schedule another lab appt (at no cost). Be advised.

## 2015-05-02 ENCOUNTER — Telehealth: Payer: Self-pay | Admitting: Internal Medicine

## 2015-05-02 NOTE — Telephone Encounter (Signed)
OK. We only had one issue with his latest hemoglobin A1c test, for which the tube was lost by the lab. He was advised to come back to have the test repeated, which she didn't do. That was the only test that I ever ordered for him.

## 2015-05-02 NOTE — Telephone Encounter (Signed)
Please review the message below and advise me.

## 2015-05-02 NOTE — Telephone Encounter (Signed)
Called pt and lvm advising him per Dr Gherghe's message below.  

## 2015-05-02 NOTE — Telephone Encounter (Signed)
Pt called cancelled his apt this month because no one ever calls and tells him his blood test results and he not coming to anymore apt until they stop ordering test and not giving results per pt, please call pt

## 2015-05-03 ENCOUNTER — Ambulatory Visit: Payer: 59 | Admitting: Internal Medicine

## 2015-05-03 ENCOUNTER — Telehealth: Payer: Self-pay | Admitting: Internal Medicine

## 2015-05-03 NOTE — Telephone Encounter (Signed)
Patient stated that he got a call stating that he need to come in for lab work. He also stated that he is just now finding out that his labs were lost. He is considering finding another doctor.

## 2015-05-07 NOTE — Telephone Encounter (Signed)
Pt stated that he was already contacted about this matter

## 2015-05-08 ENCOUNTER — Other Ambulatory Visit: Payer: 59

## 2015-06-17 NOTE — Progress Notes (Signed)
HPI: FU CAD. He underwent cardiac catheterization in 2007 secondary to a myocardial infarction. He was found to have an ejection fraction of 65%. He had a proximal 95% stenosis in his right coronary artery and the distal vessel was occluded. He subsequently underwent PCI of his right coronary artery using 3 drug-eluting stents, 2 of which were overlapping. He had nonobstructive disease in his LAD with a 50% mid and a 40% lesion further downstream. He had a 50% second diagonal as well. Last echocardiogram in January of 2008 revealed normal LV function. A Myoview was performed in Nov 2014. At that time, his ejection fraction was 55%. No ischemia or infarction. ETT 11/15 negative. Since I last saw him, the patient denies any dyspnea on exertion, orthopnea, PND, pedal edema, palpitations, syncope or chest pain.    Current Outpatient Prescriptions  Medication Sig Dispense Refill  . aspirin 81 MG tablet Take 81 mg by mouth daily.      . carvedilol (COREG) 25 MG tablet Take 1 tablet (25 mg total) by mouth 2 (two) times daily with a meal. 180 tablet 4  . cetirizine (ZYRTEC) 10 MG tablet Take 10 mg by mouth as needed.     . hydrochlorothiazide (HYDRODIURIL) 25 MG tablet Take 1 tablet (25 mg total) by mouth daily. 90 tablet 1  . lisinopril (PRINIVIL,ZESTRIL) 40 MG tablet Take 1 tablet (40 mg total) by mouth daily. 90 tablet 4  . metFORMIN (GLUCOPHAGE) 1000 MG tablet Take 1,000 mg by mouth 2 (two) times daily with a meal.    . TANZEUM 30 MG PEN     . atorvastatin (LIPITOR) 80 MG tablet Take 1 tablet (80 mg total) by mouth daily. 90 tablet 3   No current facility-administered medications for this visit.     Past Medical History  Diagnosis Date  . Hyperlipidemia   . 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 Coronary atherosclerosis of unspecified type of vessel, native or graft     RCA DES x 3 2007  . Acute myocardial infarction, unspecified site, episode of care unspecified     . Diverticulosis of colon (without mention of hemorrhage)     Admitted 01/2008  . 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  MRI abd 2010 at [facility] showed benign cystic lesion.  Marland Kitchen Unspecified hemorrhoids without mention of complication   . Esophageal reflux     ED visit 10/2009 for globus pharyngeus  . Obesity, unspecified   . Diabetes mellitus     non insulin dependent.   No D.R on exam 09/2009 or 09/01/11.  . Vitamin D deficiency 05/2010    [facility]  . Glaucoma 09/2009    Pressures normal but slight nerve thinning and corneal thickening noted on ophth exam.  . Hypogonadism, male 03/03/2011  . OSA (obstructive sleep apnea) 02/2012    Mild; recommended CPAP titration/trial as of 03/22/12.  . Internal hemorrhoids     Past Surgical History  Procedure Laterality Date  . Cholecystectomy    . Coronary angioplasty with stent placement  2007    DES x 3 to RCA.    . Colonoscopy  multiple; most recent 10/28/12    Diverticulosis, moderate internal hemorrhoids.    Social History   Social History  . Marital Status: Married    Spouse Name: N/A  . Number of Children: 2  .  Years of Education: N/A   Occupational History  . Buisness owner     real estate   Social History Main Topics  . Smoking status: Former Smoker    Quit date: 08/25/1990  . Smokeless tobacco: Never Used  . Alcohol Use: No  . Drug Use: No  . Sexual Activity:    Partners: Female   Other Topics Concern  . 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    Alcohol Use - no   Daily Caffeine Use -   Illicit Drug Use - no   Married, 1 son, 1 daughter.  Lives in [place].  Originally from [place] but lived in [place] a while before moving back to [place].   Regular exercise: just started                   ROS: no fevers or chills, productive cough,  hemoptysis, dysphasia, odynophagia, melena, hematochezia, dysuria, hematuria, rash, seizure activity, orthopnea, PND, pedal edema, claudication. Remaining systems are negative.  Physical Exam: Well-developed well-nourished in no acute distress.  Skin is warm and dry.  HEENT is normal.  Neck is supple.  Chest is clear to auscultation with normal expansion.  Cardiovascular exam is regular rate and rhythm.  Abdominal exam nontender or distended. No masses palpated. Extremities show no edema. neuro grossly intact  ECG Sinus rhythm at a rate of 70.Prior inferior infarct.

## 2015-06-18 ENCOUNTER — Encounter: Payer: Self-pay | Admitting: Cardiology

## 2015-06-18 ENCOUNTER — Ambulatory Visit (INDEPENDENT_AMBULATORY_CARE_PROVIDER_SITE_OTHER): Payer: 59 | Admitting: Cardiology

## 2015-06-18 VITALS — BP 116/74 | HR 70 | Ht 66.0 in | Wt 234.4 lb

## 2015-06-18 DIAGNOSIS — I2510 Atherosclerotic heart disease of native coronary artery without angina pectoris: Secondary | ICD-10-CM | POA: Diagnosis not present

## 2015-06-18 DIAGNOSIS — I10 Essential (primary) hypertension: Secondary | ICD-10-CM

## 2015-06-18 DIAGNOSIS — E669 Obesity, unspecified: Secondary | ICD-10-CM | POA: Diagnosis not present

## 2015-06-18 MED ORDER — ATORVASTATIN CALCIUM 80 MG PO TABS: 80.0000 mg | ORAL_TABLET | Freq: Every day | ORAL | Status: DC

## 2015-06-18 NOTE — Assessment & Plan Note (Signed)
We discussed the importance of weight loss. 

## 2015-06-18 NOTE — Assessment & Plan Note (Signed)
Continue aspirin and statin. 

## 2015-06-18 NOTE — Assessment & Plan Note (Signed)
Blood pressure controlled. Continue present medications. 

## 2015-06-18 NOTE — Assessment & Plan Note (Signed)
Discontinue Zocor. Begin Lipitor 80 mg daily. Check lipids and liver in 6 weeks.

## 2015-06-18 NOTE — Patient Instructions (Signed)
Medication Instructions:   STOP SIMVASTATIN  START ATORVASTATIN -LIPITOR 80 MG ONCE DAILY  Labwork:  Your physician recommends that you return for lab work in: [place] = DO NOT EAT PRIOR TO LAB WORK  Follow-Up:  Your physician wants you to follow-up in: [place] will receive a reminder letter in the mail two months in advance. If you don't receive a letter, please call our office to schedule the follow-up appointment.   If you need a refill on your cardiac medications before your next appointment, please call your pharmacy.

## 2015-06-28 ENCOUNTER — Encounter: Payer: Self-pay | Admitting: Internal Medicine

## 2015-06-28 NOTE — Progress Notes (Signed)
Received labs from PCP from 06/14/2015: Hemoglobin A1c 9.4% Insulin antibodies <5 Glucose 124 BUN/creatinine 11/1.05 Cholesterol: 128/158/33/63  Flu shot given at the time of the visit

## 2015-08-06 ENCOUNTER — Encounter: Payer: Self-pay | Admitting: Internal Medicine

## 2015-08-06 ENCOUNTER — Ambulatory Visit (INDEPENDENT_AMBULATORY_CARE_PROVIDER_SITE_OTHER): Payer: 59 | Admitting: Internal Medicine

## 2015-08-06 VITALS — BP 118/62 | HR 74 | Temp 98.4°F | Resp 12 | Wt 222.0 lb

## 2015-08-06 DIAGNOSIS — E1159 Type 2 diabetes mellitus with other circulatory complications: Secondary | ICD-10-CM

## 2015-08-06 NOTE — Progress Notes (Signed)
Patient ID: Henry Perry, male   DOB: 1961/04/18, 54 y.o.   MRN: [id number]  HPI: Henry Perry is a 55 y.o.-year-old male, referred by his PCP, Dr. Chapman Fitch, for management of DM2, prediabetes in 2007, diabetes ~2011; non-insulin-dependent, uncontrolled, with complications (CAD, status post stent placement). Last visit 6 mo ago.   Since last HbA1c >> he started to exercise and pay more attn to diet. He also started juicing and tries to stay under 1500 calories a day.  Last hemoglobin A1c was: Received labs from PCP: 06/14/2015: 9.4% 10/19/2014: 9.5% 09/13/2013: 6.4% 05/20/2013: 7.8% Lab Results  Component Value Date   HGBA1C 7.4* 07/12/2012   HGBA1C 7.5* 01/08/2012   HGBA1C 6.8* 09/11/2011   01/2008: 8%   Pt is on a regimen of: - Metformin 1000 mg 2x a day, with meals - Tanzeum 30 mg weekly >> switched from Victoza as he developed nausea (but was off Victoza for 4 mo) He was on Welchol in the past.  Pt not checking sugars - on the juice diet (drinks a juice in am) - am: n/c >> 100-104 >> 105-120 - 2h after b'fast: n/c  - before lunch: n/c - 2h after lunch: n/c >> 140-160 >> 140s - 3-4 pm: 112-130 >> n/c - before dinner: n/c - 2h after dinner: n/c - bedtime: 112-130 >> n/c - nighttime: n/c No lows. Lowest sugar was 100 >> 101 >> 90; he has hypoglycemia awareness at 70.  Highest sugar was 170 >> 156 >> 140.  Glucometer: True Test  Pt's meals are: - Breakfast: smoothie with coconut water + berries - Lunch: carrots + kale + chicken - Dinner: fish + salad + veggies - Snacks: fruit; yoghurt  He eliminated carbs and is now on a mediterranean diet  Received records from PCP: 06/14/2015: - Insulin antibodies <5 - Glucose 124 10/2014: - AST 26 - ACR  ND - TSH 1.05  - no CKD: 06/14/2015: BUN/creatinine 11/1.05 10/27/2014: CMP normal, except: Glu 255. BUN/Cr 9/0.98 09/13/2013: 10/1.1 Lab Results  Component Value Date   BUN 10 09/16/2012   CREATININE 1.0 09/16/2012  He  is on lisinopril 40 mg daily. - last set of lipids: 06/14/2015: 128/158/33/63 11/01/2014: 153/217/33/77 Lab Results  Component Value Date   CHOL 140 07/12/2012   HDL 33.30* 07/12/2012   LDLCALC 89 07/12/2012   TRIG 89.0 07/12/2012   CHOLHDL 4 07/12/2012  He is on simvastatin 80 mg daily and niacin 500 mg daily - last eye exam was in 11/03/2014. No DR. + glaucoma. - no numbness and tingling in his feet.  ROS: Constitutional: no weight loss/gain, no fatigue, no subjective hyperthermia/hypothermia, + nocturia Eyes: no blurry vision, no xerophthalmia ENT: no sore throat, no nodules palpated in throat, no dysphagia/odynophagia, no hoarseness Cardiovascular: no CP/SOB/palpitations/leg swelling Respiratory: no cough/SOB Gastrointestinal: no N/V/D/C Musculoskeletal: no muscle/joint aches Skin: no rashes Neurological: no tremors/numbness/tingling/dizziness  I reviewed pt's medications, allergies, PMH, social hx, family hx, and changes were documented in the history of present illness. Otherwise, unchanged from my initial visit note.  Past Medical History  Diagnosis Date  . Hyperlipidemia   . 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 Coronary atherosclerosis of unspecified type of vessel, native or graft     RCA DES x 3 2007  . Acute myocardial infarction, unspecified site, episode of care unspecified   . Diverticulosis of colon (without mention of hemorrhage)     Admitted 01/2008  . 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  MRI abd 2010 at [facility] showed benign cystic lesion.  Marland Kitchen Unspecified hemorrhoids without mention of complication   . Esophageal reflux     ED visit 10/2009 for globus pharyngeus  . Obesity, unspecified   . Diabetes mellitus     non insulin dependent.   No D.R on exam 09/2009 or 09/01/11.  . Vitamin D deficiency 05/2010    [facility]  . Glaucoma 09/2009    Pressures  normal but slight nerve thinning and corneal thickening noted on ophth exam.  . Hypogonadism, male 03/03/2011  . OSA (obstructive sleep apnea) 02/2012    Mild; recommended CPAP titration/trial as of 03/22/12.  . Internal hemorrhoids    Past Surgical History  Procedure Laterality Date  . Cholecystectomy    . Coronary angioplasty with stent placement  2007    DES x 3 to RCA.    . Colonoscopy  multiple; most recent 10/28/12    Diverticulosis, moderate internal hemorrhoids.   History   Social History  . Marital Status: Married    Spouse Name: N/A  . Number of Children: 2  . Years of Education: N/A   Occupational History  . Buisness owner     real estate   Social History Main Topics  . Smoking status: Former Smoker    Quit date: 08/25/1990  . Smokeless tobacco: Never Used  . Alcohol Use: No  . Drug Use: No  . 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    Alcohol Use - no   Daily Caffeine Use -   Illicit Drug Use - no   Married, 1 son, 1 daughter.  Lives in [place].  Originally from [place] but lived in [place] a while before moving back to [place].   Regular exercise: just started   Current Outpatient Prescriptions on File Prior to Visit  Medication Sig Dispense Refill  . aspirin 81 MG tablet Take 81 mg by mouth daily.      Marland Kitchen atorvastatin (LIPITOR) 80 MG tablet Take 1 tablet (80 mg total) by mouth daily. 90 tablet 3  . carvedilol (COREG) 25 MG tablet Take 1 tablet (25 mg total) by mouth 2 (two) times daily with a meal. 180 tablet 4  . cetirizine (ZYRTEC) 10 MG tablet Take 10 mg by mouth as needed.     . hydrochlorothiazide (HYDRODIURIL) 25 MG tablet Take 1 tablet (25 mg total) by mouth daily. 90 tablet 1  . lisinopril (PRINIVIL,ZESTRIL) 40 MG tablet Take 1 tablet (40 mg total) by mouth daily. 90 tablet 4  . metFORMIN (GLUCOPHAGE) 1000 MG tablet Take 1,000 mg by mouth 2  (two) times daily with a meal.    . TANZEUM 30 MG PEN      No current facility-administered medications on file prior to visit.   No Known Allergies Family History  Problem Relation Age of Onset  . Prostate cancer Father   . Diabetes Father    PE: BP 118/62 mmHg  Pulse 74  Temp(Src) 98.4 F (36.9 C) (Oral)  Resp 12  Wt 222 lb (100.699 kg)  SpO2 98% Body mass index is 35.85 kg/(m^2).  Wt Readings from Last 3 Encounters:  08/06/15 222 lb (100.699 kg)  06/18/15 234 lb 6.4 oz (106.323 kg)  01/30/15 223 lb (  101.152 kg)   Constitutional: overweight, in NAD Eyes: PERRLA, EOMI, no exophthalmos ENT: moist mucous membranes, no thyromegaly, no cervical lymphadenopathy Cardiovascular: RRR, No MRG Respiratory: CTA B Gastrointestinal: abdomen soft, NT, ND, BS+ Musculoskeletal: no deformities, strength intact in all 4 Skin: moist, warm, no rashes Neurological: no tremor with outstretched hands, DTR normal in all 4  ASSESSMENT: 1. DM2, non-insulin-dependent, uncontrolled, with complications  - CAD  PLAN:  1. Patient with uncontrolled diabetes, on Metformin + Tanzeum. Reviewed last HbA1c from 2 mo ago >> high, at 9.4%. Since then, he started to improve diet and exercise and started juicing >> lost 12 lbs ad sugars are better! I congratulated him! - I suggested to:  Patient Instructions  Please continue Metformin 1000 mg 2x a day. Continue Tanzeum 30 mg weekly.  Please return in 1.5 months with your sugar log.  - continue checking sugars at different times of the day - check 1-2 times a day, rotating checks - advised for yearly eye exams >> he is UTD - he had flu shot this season - Return to clinic in 1.5 mo with sugar log >> will check HbA1c now.

## 2015-08-06 NOTE — Patient Instructions (Signed)
Patient Instructions  Please continue Metformin 1000 mg 2x a day. Continue Tanzeum 30 mg weekly.  Please return in 1.5 months with your sugar log.

## 2015-09-26 ENCOUNTER — Ambulatory Visit: Payer: 59 | Admitting: Internal Medicine

## 2016-06-19 ENCOUNTER — Other Ambulatory Visit: Payer: Self-pay | Admitting: Cardiology

## 2016-06-19 DIAGNOSIS — I2510 Atherosclerotic heart disease of native coronary artery without angina pectoris: Secondary | ICD-10-CM

## 2016-07-01 NOTE — Progress Notes (Signed)
HPI: FU CAD. He underwent cardiac catheterization in 2007 secondary to a myocardial infarction. He was found to have an ejection fraction of 65%. He had a proximal 95% stenosis in his right coronary artery and the distal vessel was occluded. He subsequently underwent PCI of his right coronary artery using 3 drug-eluting stents, 2 of which were overlapping. He had nonobstructive disease in his LAD with a 50% mid and a 40% lesion further downstream. He had a 50% second diagonal as well. Last echocardiogram in January of 2008 revealed normal LV function. A Myoview was performed in Nov 2014. At that time, his ejection fraction was 55%. No ischemia or infarction. ETT 11/15 negative. Since I last saw him, the patient denies any dyspnea on exertion, orthopnea, PND, pedal edema, palpitations, syncope or chest pain.   Current Outpatient Prescriptions  Medication Sig Dispense Refill  . aspirin 81 MG tablet Take 81 mg by mouth daily.      Marland Kitchen atorvastatin (LIPITOR) 80 MG tablet TAKE 1 TABLET(80 MG) BY MOUTH DAILY 90 tablet 0  . carvedilol (COREG) 25 MG tablet Take 1 tablet (25 mg total) by mouth 2 (two) times daily with a meal. 180 tablet 4  . cetirizine (ZYRTEC) 10 MG tablet Take 10 mg by mouth as needed.     . hydrochlorothiazide (HYDRODIURIL) 25 MG tablet Take 1 tablet (25 mg total) by mouth daily. 90 tablet 1  . lisinopril (PRINIVIL,ZESTRIL) 40 MG tablet Take 1 tablet (40 mg total) by mouth daily. 90 tablet 4  . metFORMIN (GLUCOPHAGE) 1000 MG tablet Take 1,000 mg by mouth 2 (two) times daily with a meal.     No current facility-administered medications for this visit.      Past Medical History:  Diagnosis Date  . Acute myocardial infarction, unspecified site, episode of care unspecified   . Coronary atherosclerosis of unspecified type of vessel, native or graft    RCA DES x 3 2007  . Diabetes mellitus    non insulin dependent.   No D.R on exam 09/2009 or 09/01/11.  . Diverticulosis of colon  (without mention of hemorrhage)    Admitted 01/2008  . Esophageal reflux    ED visit 10/2009 for globus pharyngeus  . Glaucoma 09/2009   Pressures normal but slight nerve thinning and corneal thickening noted on ophth exam.  . Hyperlipidemia   . 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 Hypogonadism, male 03/03/2011  . Internal hemorrhoids   . 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  MRI abd 2010 at [facility] showed benign cystic lesion.  . Obesity, unspecified   . OSA (obstructive sleep apnea) 02/2012   Mild; recommended CPAP titration/trial as of 03/22/12.  Marland Kitchen Unspecified hemorrhoids without mention of complication   . Vitamin D deficiency 05/2010   [facility]    Past Surgical History:  Procedure Laterality Date  . CHOLECYSTECTOMY    . COLONOSCOPY  multiple; most recent 10/28/12   Diverticulosis, moderate internal hemorrhoids.  . CORONARY ANGIOPLASTY WITH STENT PLACEMENT  2007   DES x 3 to RCA.      Social History   Social History  . Marital status: Married    Spouse name: N/A  . Number of children: 2  . Years of education: N/A   Occupational History  . Buisness owner     real estate   Social History Main  Topics  . Smoking status: Former Smoker    Quit date: 08/25/1990  . Smokeless tobacco: Never Used  . Alcohol use No  . Drug use: No  . Sexual activity: Yes    Partners: Female   Other Topics Concern  . 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    Alcohol Use - no   Daily Caffeine Use -   Illicit Drug Use - no   Married, 1 son, 1 daughter.  Lives in [place].  Originally from [place] but lived in [place] a while before moving back to [place].   Regular exercise: just started                   Family History  Problem Relation Age of Onset  . Prostate cancer Father   . Diabetes Father      ROS: no fevers or chills, productive cough, hemoptysis, dysphasia, odynophagia, melena, hematochezia, dysuria, hematuria, rash, seizure activity, orthopnea, PND, pedal edema, claudication. Remaining systems are negative.  Physical Exam: Well-developed well-nourished in no acute distress.  Skin is warm and dry.  HEENT is normal.  Neck is supple.  Chest is clear to auscultation with normal expansion.  Cardiovascular exam is regular rate and rhythm.  Abdominal exam nontender or distended. No masses palpated. Extremities show no edema. neuro grossly intact  ECG-sinus rhythm at a rate of 69. No ST changes.  A/P  1 Coronary artery disease-continue aspirin and statin.  2 hypertension-blood pressure controlled. Continue present medications.  3 hyperlipidemia-continue statin.  Kirk Ruths, MD

## 2016-07-03 ENCOUNTER — Ambulatory Visit (INDEPENDENT_AMBULATORY_CARE_PROVIDER_SITE_OTHER): Payer: BLUE CROSS/BLUE SHIELD | Admitting: Cardiology

## 2016-07-03 ENCOUNTER — Encounter: Payer: Self-pay | Admitting: Cardiology

## 2016-07-03 VITALS — BP 120/90 | HR 69 | Ht 66.0 in | Wt 217.0 lb

## 2016-07-03 DIAGNOSIS — I10 Essential (primary) hypertension: Secondary | ICD-10-CM

## 2016-07-03 DIAGNOSIS — E7800 Pure hypercholesterolemia, unspecified: Secondary | ICD-10-CM

## 2016-07-03 DIAGNOSIS — I2510 Atherosclerotic heart disease of native coronary artery without angina pectoris: Secondary | ICD-10-CM | POA: Diagnosis not present

## 2016-07-03 NOTE — Patient Instructions (Signed)
Medication Instructions:  Your physician recommends that you continue on your current medications as directed. Please refer to the Current Medication list given to you today.  Labwork: None   Testing/Procedures: None   Follow-Up: Your physician wants you to follow-up in: 12 MONTHS WITH DR CRENSHAW.  You will receive a reminder letter in the mail two months in advance. If you don't receive a letter, please call our office to schedule the follow-up appointment.  Any Other Special Instructions Will Be Listed Below (If Applicable).  If you need a refill on your cardiac medications before your next appointment, please call your pharmacy.   

## 2017-01-06 ENCOUNTER — Emergency Department (HOSPITAL_BASED_OUTPATIENT_CLINIC_OR_DEPARTMENT_OTHER): Payer: BLUE CROSS/BLUE SHIELD

## 2017-01-06 ENCOUNTER — Emergency Department (HOSPITAL_BASED_OUTPATIENT_CLINIC_OR_DEPARTMENT_OTHER)
Admission: EM | Admit: 2017-01-06 | Discharge: 2017-01-06 | Disposition: A | Discharge status: Home / Self Care | Payer: BLUE CROSS/BLUE SHIELD | Attending: Emergency Medicine | Admitting: Emergency Medicine

## 2017-01-06 ENCOUNTER — Encounter (HOSPITAL_BASED_OUTPATIENT_CLINIC_OR_DEPARTMENT_OTHER): Payer: Self-pay | Admitting: Emergency Medicine

## 2017-01-06 ENCOUNTER — Other Ambulatory Visit: Payer: Self-pay

## 2017-01-06 ENCOUNTER — Telehealth: Payer: Self-pay | Admitting: Cardiology

## 2017-01-06 DIAGNOSIS — E119 Type 2 diabetes mellitus without complications: Secondary | ICD-10-CM | POA: Diagnosis not present

## 2017-01-06 DIAGNOSIS — I2510 Atherosclerotic heart disease of native coronary artery without angina pectoris: Secondary | ICD-10-CM | POA: Diagnosis not present

## 2017-01-06 DIAGNOSIS — R6884 Jaw pain: Secondary | ICD-10-CM

## 2017-01-06 DIAGNOSIS — Z79899 Other long term (current) drug therapy: Secondary | ICD-10-CM | POA: Insufficient documentation

## 2017-01-06 DIAGNOSIS — I10 Essential (primary) hypertension: Secondary | ICD-10-CM | POA: Diagnosis not present

## 2017-01-06 DIAGNOSIS — Z87891 Personal history of nicotine dependence: Secondary | ICD-10-CM | POA: Insufficient documentation

## 2017-01-06 DIAGNOSIS — Z7984 Long term (current) use of oral hypoglycemic drugs: Secondary | ICD-10-CM | POA: Insufficient documentation

## 2017-01-06 DIAGNOSIS — Z8679 Personal history of other diseases of the circulatory system: Secondary | ICD-10-CM

## 2017-01-06 LAB — CBC WITH DIFFERENTIAL/PLATELET
BASOS PCT: 0 %
Basophils Absolute: 0 10*3/uL (ref 0.0–0.1)
EOS ABS: 0.2 10*3/uL (ref 0.0–0.7)
Eosinophils Relative: 4 %
HEMATOCRIT: 43.8 % (ref 39.0–52.0)
Hemoglobin: 15.1 g/dL (ref 13.0–17.0)
Lymphocytes Relative: 43 %
Lymphs Abs: 2.4 10*3/uL (ref 0.7–4.0)
MCH: 28.9 pg (ref 26.0–34.0)
MCHC: 34.5 g/dL (ref 30.0–36.0)
MCV: 83.9 fL (ref 78.0–100.0)
MONO ABS: 0.4 10*3/uL (ref 0.1–1.0)
Monocytes Relative: 8 %
NEUTROS ABS: 2.5 10*3/uL (ref 1.7–7.7)
Neutrophils Relative %: 45 %
Platelets: 227 10*3/uL (ref 150–400)
RBC: 5.22 MIL/uL (ref 4.22–5.81)
RDW: 14.5 % (ref 11.5–15.5)
WBC: 5.5 10*3/uL (ref 4.0–10.5)

## 2017-01-06 LAB — COMPREHENSIVE METABOLIC PANEL
ALBUMIN: 4.6 g/dL (ref 3.5–5.0)
ALT: 21 U/L (ref 17–63)
ANION GAP: 12 (ref 5–15)
AST: 28 U/L (ref 15–41)
Alkaline Phosphatase: 97 U/L (ref 38–126)
BUN: 15 mg/dL (ref 6–20)
CHLORIDE: 101 mmol/L (ref 101–111)
CO2: 25 mmol/L (ref 22–32)
Calcium: 9.8 mg/dL (ref 8.9–10.3)
Creatinine, Ser: 0.98 mg/dL (ref 0.61–1.24)
GFR calc Af Amer: >60 mL/min (ref >60)
GFR calc non Af Amer: >60 mL/min (ref >60)
GLUCOSE: 139 mg/dL — AB (ref 65–99)
POTASSIUM: 3.4 mmol/L — AB (ref 3.5–5.1)
SODIUM: 138 mmol/L (ref 135–145)
Total Bilirubin: 1.4 mg/dL — ABNORMAL HIGH (ref 0.3–1.2)
Total Protein: 7.6 g/dL (ref 6.5–8.1)

## 2017-01-06 LAB — TROPONIN I: Troponin I: <0.03 ng/mL (ref <0.03)

## 2017-01-06 NOTE — Discharge Instructions (Signed)
It was our pleasure to provide your ER care today - we hope that you feel better.  Follow up with your cardiologist in the coming week - call office today or tomorrow morning to arrange appointment.   Return to ER if worse, new symptoms, trouble breathing, recurrent or persistent chest pain, other  concern.

## 2017-01-06 NOTE — Telephone Encounter (Signed)
Received call from Henry Perry, pt wife/DPR listed OK to speak to her about his care. Voices c/o radiating jaw and rt arm pain which began yesterday intermittently, symptoms increasing in severity today. Asking advice on what to do. Since c/o is of active symptoms I advised ED eval to which she agreed -- will take pt to hosp for eval. Have notified Trish, [facility] cardmaster for cardiology - she voiced acknowledgment.

## 2017-01-06 NOTE — ED Provider Notes (Signed)
[place] DEPT [facility] Provider Note   CSN: [id number] Arrival date & time: 01/06/17  1301     History   Chief Complaint Chief Complaint  Patient presents with  . Jaw Pain    HPI Chi Henry Perry is a 56 y.o. male.  Patient w hx cad, presents c/o jaw pain for past week. States initially was right upper jaw, and then later noted bilateral jaw pain. Symptoms onset at rest. Pain constant in past week. States then moved to right arm. No associated sob, nv or diaphoresis. No fever or chills. Denies cough or uri c/o. No heartburn. Denies leg pain or swelling. No pleuritic pain. Denies unusual fatigue or doe.  Pt states went on 2.5 mile walk today without chest pain or change in symptoms.    The history is provided by the patient.    Past Medical History:  Diagnosis Date  . Acute myocardial infarction, unspecified site, episode of care unspecified   . Coronary atherosclerosis of unspecified type of vessel, native or graft    RCA DES x 3 2007  . Diabetes mellitus    non insulin dependent.   No D.R on exam 09/2009 or 09/01/11.  . Diverticulosis of colon (without mention of hemorrhage)    Admitted 01/2008  . Esophageal reflux    ED visit 10/2009 for globus pharyngeus  . Glaucoma 09/2009   Pressures normal but slight nerve thinning and corneal thickening noted on ophth exam.  . Hyperlipidemia   . 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 Hypogonadism, male 03/03/2011  . Internal hemorrhoids   . 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  MRI abd 2010 at [facility] showed benign cystic lesion.  . Obesity, unspecified   . OSA (obstructive sleep apnea) 02/2012   Mild; recommended CPAP titration/trial as of 03/22/12.  Marland Kitchen Unspecified hemorrhoids without mention of complication   . Vitamin D deficiency 05/2010   [facility]    Patient Active Problem List   Diagnosis Date Noted  . Atherosclerotic heart  disease of native coronary artery without angina pectoris 11/09/2014  . Chest pain 06/16/2014  . Hyperlipemia, mixed 07/12/2012  . Obstructive sleep apnea 07/12/2012  . Prostate cancer screening 01/22/2012  . OSA (obstructive sleep apnea) 01/22/2012  . Type 2 diabetes mellitus with other circulatory complications ([place]) 88/41/6606  . HYPERLIPIDEMIA 05/25/2009  . OBESITY 05/25/2009  . Essential hypertension 05/25/2009  . GERD 05/25/2009  . Coronary atherosclerosis 06/25/2006    Past Surgical History:  Procedure Laterality Date  . CHOLECYSTECTOMY    . COLONOSCOPY  multiple; most recent 10/28/12   Diverticulosis, moderate internal hemorrhoids.  . CORONARY ANGIOPLASTY WITH STENT PLACEMENT  2007   DES x 3 to RCA.         Home Medications    Prior to Admission medications   Medication Sig Start Date End Date Taking? Authorizing Provider  aspirin 81 MG tablet Take 81 mg by mouth daily.      [provider]  atorvastatin (LIPITOR) 80 MG tablet TAKE 1 TABLET(80 MG) BY MOUTH DAILY 06/20/16   Lelon Perla, MD  carvedilol (COREG) 25 MG tablet Take 1 tablet (25 mg total) by mouth 2 (two) times daily with a meal. 07/12/12   McGowen, Adrian Blackwater, MD  cetirizine (ZYRTEC) 10 MG tablet Take 10 mg by mouth as needed.     [provider]  hydrochlorothiazide (HYDRODIURIL) 25  MG tablet Take 1 tablet (25 mg total) by mouth daily. 05/02/13   McGowen, Adrian Blackwater, MD  lisinopril (PRINIVIL,ZESTRIL) 40 MG tablet Take 1 tablet (40 mg total) by mouth daily. 07/12/12   McGowen, Adrian Blackwater, MD  metFORMIN (GLUCOPHAGE) 1000 MG tablet Take 1,000 mg by mouth 2 (two) times daily with a meal.    [provider]    Family History Family History  Problem Relation Age of Onset  . Prostate cancer Father   . Diabetes Father     Social History Social History  Substance Use Topics  . Smoking status: Former Smoker    Quit date: 08/25/1990  . Smokeless tobacco: Never Used  . Alcohol use No      Allergies   Patient has no known allergies.   Review of Systems Review of Systems  Constitutional: Negative for fever.  HENT: Negative for sore throat.   Eyes: Negative for redness.  Respiratory: Negative for shortness of breath.   Cardiovascular: Negative for palpitations and leg swelling.  Gastrointestinal: Negative for abdominal pain and vomiting.  Genitourinary: Negative for dysuria and flank pain.  Musculoskeletal: Negative for back pain and neck pain.  Skin: Negative for rash.  Neurological: Negative for headaches.  Hematological: Does not bruise/bleed easily.  Psychiatric/Behavioral: Negative for confusion.     Physical Exam Updated Vital Signs BP 120/84   Pulse 74   Temp 98.7 F (37.1 C) (Oral)   Resp 16   Ht 5\' 6"  (1.676 m)   Wt 97.5 kg   SpO2 96%   BMI 34.70 kg/m   Physical Exam  Constitutional: He appears well-developed and well-nourished. No distress.  HENT:  Mouth/Throat: Oropharynx is clear and moist.  No facial swelling. No oral/dental infection or swelling. No trismus.   Eyes: Conjunctivae are normal.  Neck: Neck supple. No tracheal deviation present.  Cardiovascular: Normal rate, regular rhythm, normal heart sounds and intact distal pulses.  Exam reveals no gallop and no friction rub.   No murmur heard. Pulmonary/Chest: Effort normal and breath sounds normal. No accessory muscle usage. No respiratory distress.  Abdominal: Soft. He exhibits no distension. There is no tenderness.  Musculoskeletal: He exhibits no edema or tenderness.  Neurological: He is alert.  Skin: Skin is warm and dry. He is not diaphoretic.  Psychiatric: He has a normal mood and affect.  Nursing note and vitals reviewed.    ED Treatments / Results  Labs (all labs ordered are listed, but only abnormal results are displayed) Results for orders placed or performed during the hospital encounter of 01/06/17  CBC with Differential  Result Value Ref Range   WBC 5.5 4.0 -  10.5 K/uL   RBC 5.22 4.22 - 5.81 MIL/uL   Hemoglobin 15.1 13.0 - 17.0 g/dL   HCT 43.8 39.0 - 52.0 %   MCV 83.9 78.0 - 100.0 fL   MCH 28.9 26.0 - 34.0 pg   MCHC 34.5 30.0 - 36.0 g/dL   RDW 14.5 11.5 - 15.5 %   Platelets 227 150 - 400 K/uL   Neutrophils Relative % 45 %   Neutro Abs 2.5 1.7 - 7.7 K/uL   Lymphocytes Relative 43 %   Lymphs Abs 2.4 0.7 - 4.0 K/uL   Monocytes Relative 8 %   Monocytes Absolute 0.4 0.1 - 1.0 K/uL   Eosinophils Relative 4 %   Eosinophils Absolute 0.2 0.0 - 0.7 K/uL   Basophils Relative 0 %   Basophils Absolute 0.0 0.0 - 0.1 K/uL  Comprehensive  metabolic panel  Result Value Ref Range   Sodium 138 135 - 145 mmol/L   Potassium 3.4 (L) 3.5 - 5.1 mmol/L   Chloride 101 101 - 111 mmol/L   CO2 25 22 - 32 mmol/L   Glucose, Bld 139 (H) 65 - 99 mg/dL   BUN 15 6 - 20 mg/dL   Creatinine, Ser 0.98 0.61 - 1.24 mg/dL   Calcium 9.8 8.9 - 10.3 mg/dL   Total Protein 7.6 6.5 - 8.1 g/dL   Albumin 4.6 3.5 - 5.0 g/dL   AST 28 15 - 41 U/L   ALT 21 17 - 63 U/L   Alkaline Phosphatase 97 38 - 126 U/L   Total Bilirubin 1.4 (H) 0.3 - 1.2 mg/dL   GFR calc non Af Amer >60 >60 mL/min   GFR calc Af Amer >60 >60 mL/min   Anion gap 12 5 - 15  Troponin I  Result Value Ref Range   Troponin I <0.03 <0.03 ng/mL   Dg Chest 2 View  Result Date: 01/06/2017 CLINICAL DATA:  Jaw and right arm pain EXAM: CHEST  2 VIEW COMPARISON:  05/27/2010 FINDINGS: The heart size and mediastinal contours are within normal limits. Both lungs are clear. The visualized skeletal structures are unremarkable. IMPRESSION: No active cardiopulmonary disease. Electronically Signed   By: Donavan Foil M.D.   On: 01/06/2017 14:26    EKG  EKG Interpretation  Date/Time:  Tuesday Jan 06 2017 13:09:35 EDT Ventricular Rate:  82 PR Interval:  190 QRS Duration: 90 QT Interval:  372 QTC Calculation: 434 R Axis:   51 Text Interpretation:  Normal sinus rhythm Nonspecific T wave abnormality No significant change since  last tracing Confirmed by Ashok Cordia  MD, Lennette Bihari ([id number]) on 01/06/2017 1:47:48 PM       Radiology Dg Chest 2 View  Result Date: 01/06/2017 CLINICAL DATA:  Jaw and right arm pain EXAM: CHEST  2 VIEW COMPARISON:  05/27/2010 FINDINGS: The heart size and mediastinal contours are within normal limits. Both lungs are clear. The visualized skeletal structures are unremarkable. IMPRESSION: No active cardiopulmonary disease. Electronically Signed   By: Donavan Foil M.D.   On: 01/06/2017 14:26    Procedures Procedures (including critical care time)  Medications Ordered in ED Medications - No data to display   Initial Impression / Assessment and Plan / ED Course  I have reviewed the triage vital signs and the nursing notes.  Pertinent labs & imaging results that were available during my care of the patient were reviewed by me and considered in my medical decision making (see chart for details).  Iv.  Labs. cxr. ecg.  ecg unchanged from baseline.  cxr neg acute.   After symptoms present, constant, at rest, for 1 week, trop negative. No change in exercise/exertion today.   Given hx, will have f/u cardiology as outpt.     Final Clinical Impressions(s) / ED Diagnoses   Final diagnoses:  None    New Prescriptions New Prescriptions   No medications on file     Lajean Saver, MD 01/06/17 [phone]

## 2017-01-06 NOTE — ED Triage Notes (Signed)
Patient reports jaw pain as well as right arm pain x 1 week.  Denies chest pain at present but states his symptoms are similar to when he had an MI and stents placed in the past.  Denies nausea, vomiting and shortness of breath.

## 2017-01-06 NOTE — ED Notes (Signed)
Pt reports hx of MI 11 years ago. States "I am just taking precaution because I've had similar symptoms with my jaw when I had my MI." Labs have been collected Pt placed on the monitor. NAD at this time.

## 2018-04-29 ENCOUNTER — Ambulatory Visit (INDEPENDENT_AMBULATORY_CARE_PROVIDER_SITE_OTHER): Payer: Non-veteran care | Admitting: Cardiology

## 2018-04-29 ENCOUNTER — Encounter: Payer: Self-pay | Admitting: Cardiology

## 2018-04-29 VITALS — BP 142/90 | HR 60 | Ht 65.0 in | Wt 210.0 lb

## 2018-04-29 DIAGNOSIS — R0609 Other forms of dyspnea: Secondary | ICD-10-CM

## 2018-04-29 DIAGNOSIS — F4310 Post-traumatic stress disorder, unspecified: Secondary | ICD-10-CM

## 2018-04-29 DIAGNOSIS — I2510 Atherosclerotic heart disease of native coronary artery without angina pectoris: Secondary | ICD-10-CM

## 2018-04-29 DIAGNOSIS — E119 Type 2 diabetes mellitus without complications: Secondary | ICD-10-CM

## 2018-04-29 DIAGNOSIS — Z9861 Coronary angioplasty status: Secondary | ICD-10-CM

## 2018-04-29 DIAGNOSIS — R079 Chest pain, unspecified: Secondary | ICD-10-CM

## 2018-04-29 DIAGNOSIS — E782 Mixed hyperlipidemia: Secondary | ICD-10-CM

## 2018-04-29 DIAGNOSIS — I10 Essential (primary) hypertension: Secondary | ICD-10-CM

## 2018-04-29 MED ORDER — NITROGLYCERIN 0.4 MG SL SUBL: 0.4000 mg | SUBLINGUAL_TABLET | SUBLINGUAL | 3 refills | Status: AC | PRN

## 2018-04-29 NOTE — Addendum Note (Signed)
Addended by: Ulice Brilliant T on: 04/29/2018 10:34 AM   Modules accepted: Orders

## 2018-04-29 NOTE — Assessment & Plan Note (Signed)
On Glucophage 

## 2018-04-29 NOTE — Assessment & Plan Note (Signed)
On Coreg, HCTZ, and ACE- followed by [facility]

## 2018-04-29 NOTE — Progress Notes (Signed)
04/29/2018 Henry Perry   1960-10-30  [id number]  Primary Physician Parke Poisson, MD Primary Cardiologist: Dr Stanford Breed  HPI:  57 y/o male, followed by the [place], with a history of CAD, s/p IMI 2007 treated with RCA DES x 3. He had a Myoview in 2014 that was low risk. Other medical problems include HTN, HLD, NIDDM, and PTSD ([employer] from [employer]-'[place]).  His LOV with Dr Stanford Breed was Nov 2017. He presents now because he has noticed exertional dyspnea and chest tightness for 3 months. He has not used NTG and in fact doesn't have any. He denies rest symptoms, he just feels he is unable to do what he was able to do physically 3 months ago.    Current Outpatient Medications  Medication Sig Dispense Refill  . aspirin 81 MG tablet Take 81 mg by mouth daily.      Marland Kitchen atorvastatin (LIPITOR) 80 MG tablet TAKE 1 TABLET(80 MG) BY MOUTH DAILY 90 tablet 0  . carvedilol (COREG) 25 MG tablet Take 1 tablet (25 mg total) by mouth 2 (two) times daily with a meal. 180 tablet 4  . cetirizine (ZYRTEC) 10 MG tablet Take 10 mg by mouth as needed.     . hydrochlorothiazide (HYDRODIURIL) 25 MG tablet Take 1 tablet (25 mg total) by mouth daily. 90 tablet 1  . lisinopril (PRINIVIL,ZESTRIL) 40 MG tablet Take 1 tablet (40 mg total) by mouth daily. 90 tablet 4  . metFORMIN (GLUCOPHAGE) 1000 MG tablet Take 1,000 mg by mouth 2 (two) times daily with a meal.     No current facility-administered medications for this visit.     No Known Allergies  Past Medical History:  Diagnosis Date  . Acute myocardial infarction, unspecified site, episode of care unspecified   . Coronary atherosclerosis of unspecified type of vessel, native or graft    RCA DES x 3 2007  . Diabetes mellitus    non insulin dependent.   No D.R on exam 09/2009 or 09/01/11.  . Diverticulosis of colon (without mention of hemorrhage)    Admitted 01/2008  . Esophageal reflux    ED visit 10/2009 for globus pharyngeus  . Glaucoma 09/2009   Pressures normal but  slight nerve thinning and corneal thickening noted on ophth exam.  . Hyperlipidemia   . 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 Hypogonadism, male 03/03/2011  . Internal hemorrhoids   . 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  MRI abd 2010 at [facility] showed benign cystic lesion.  . Obesity, unspecified   . OSA (obstructive sleep apnea) 02/2012   Mild; recommended CPAP titration/trial as of 03/22/12.  Marland Kitchen Unspecified hemorrhoids without mention of complication   . Vitamin D deficiency 05/2010   [facility]    Social History   Socioeconomic History  . Marital status: Married    Spouse name: Not on file  . Number of children: 2  . Years of education: Not on file  . Highest education level: Not on file  Occupational History  . Occupation: IT trainer    Comment: real estate  Social Needs  . Financial resource strain: Not on file  . Food insecurity:    Worry: Not on file    Inability: Not on file  . Transportation needs:    Medical: Not on file    Non-medical: Not on file  Tobacco Use  . Smoking  status: Former Smoker    Last attempt to quit: 08/25/1990    Years since quitting: 27.6  . Smokeless tobacco: Never Used  Substance and Sexual Activity  . Alcohol use: No  . Drug use: No  . Sexual activity: Yes    Partners: Female  Lifestyle  . Physical activity:    Days per week: Not on file    Minutes per session: Not on file  . Stress: Not on file  Relationships  . Social connections:    Talks on phone: Not on file    Gets together: Not on file    Attends religious service: Not on file    Active member of club or organization: Not on file    Attends meetings of clubs or organizations: Not on file    Relationship status: Not on file  . Intimate partner violence:    Fear of current or ex partner: Not on file    Emotionally abused: Not on file    Physically abused: Not on file     Forced sexual activity: Not on file  Other Topics Concern  . 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    Alcohol Use - no   Daily Caffeine Use -   Illicit Drug Use - no   Married, 1 son, 1 daughter.  Lives in [place].  Originally from [place] but lived in [place] a while before moving back to [place].   Regular exercise: just started                    Family History  Problem Relation Age of Onset  . Prostate cancer Father   . Diabetes Father      Review of Systems: General: negative for chills, fever, night sweats or weight changes.  Cardiovascular: negative for chest pain, dyspnea on exertion, edema, orthopnea, palpitations, paroxysmal nocturnal dyspnea or shortness of breath Dermatological: negative for rash Respiratory: negative for cough or wheezing Urologic: negative for hematuria Abdominal: negative for nausea, vomiting, diarrhea, bright red blood per rectum, melena, or hematemesis Neurologic: negative for visual changes, syncope, or dizziness All other systems reviewed and are otherwise negative except as noted above.    Blood pressure (!) 142/90, pulse 60, height 5\' 5"  (1.651 m), weight 210 lb (95.3 kg).  General appearance: alert, cooperative and no distress Neck: no carotid bruit and no JVD Lungs: clear to auscultation bilaterally Heart: regular rate and rhythm Extremities: no edema Skin: Skin color, texture, turgor normal. No rashes or lesions Neurologic: Grossly normal  EKG NSR-HR 60  ASSESSMENT AND PLAN:   Chest tightness Pt seen today with complaints of exertional chest tightness and dyspnea for the past 3 months  CAD S/P percutaneous coronary angioplasty MI 2007- DES x 3 RCA.  Residual 50% LAD. Myoview low risk 2014  Hyperlipemia, mixed Check fasting lipids  Non-insulin dependent type 2 diabetes mellitus (HCC) On Glucophage  Essential hypertension On  Coreg, HCTZ, and [facility]- [facility]  PTSD (post-traumatic stress disorder) Followed by the [facility]  I suggested we proceed with an exercise Myoview. I'll also check lipids and a CMET as he will be fasting. An Rx for SL NTG will be provided.   Kerin Ransom PA-C 04/29/2018 10:03 AM

## 2018-04-29 NOTE — Assessment & Plan Note (Signed)
Check fasting lipids 

## 2018-04-29 NOTE — Assessment & Plan Note (Signed)
Pt seen today with complaints of exertional chest tightness and dyspnea for the past 3 months

## 2018-04-29 NOTE — Assessment & Plan Note (Signed)
[facility] 

## 2018-04-29 NOTE — Assessment & Plan Note (Signed)
MI 2007- DES x 3 RCA.  Residual 50% LAD. Myoview low risk 2014

## 2018-04-29 NOTE — Patient Instructions (Addendum)
Medication Instructions:  Your physician recommends that you continue on your current medications as directed. Please refer to the Current Medication list given to you today.  If you need a refill on your cardiac medications before your next appointment, please call your pharmacy.  Labwork: Your physician recommends that you return for lab work in: COMPLETE ON THE SAME DAY AS YOUR STRESS TEST FASTING LIPID, [place] in our office  Testing/Procedures: Your physician has requested that you have en exercise stress myoview. For further information please visit [email or web address]. Please follow instruction sheet, as given.  ON THE MORNING OF YOUR TEST HOLD YOUR COREG MEDICATION  Follow-Up: Your physician recommends that you schedule a follow-up appointment in: 3 WEEKS WITH [facility] ON A DAY DR CRENSHAW IS IN THE OFFICE-APPT MADE  Any Other Special Instructions Will Be Listed Below (If Applicable).

## 2018-04-30 ENCOUNTER — Telehealth (HOSPITAL_COMMUNITY): Payer: Self-pay

## 2018-04-30 NOTE — Telephone Encounter (Signed)
Encounter complete. 

## 2018-05-05 ENCOUNTER — Ambulatory Visit (HOSPITAL_COMMUNITY)
Admission: RE | Admit: 2018-05-05 | Discharge: 2018-05-05 | Disposition: A | Discharge status: Home / Self Care | Payer: Non-veteran care | Source: Ambulatory Visit | Attending: Cardiology | Admitting: Cardiology

## 2018-05-05 DIAGNOSIS — R0609 Other forms of dyspnea: Secondary | ICD-10-CM

## 2018-05-05 DIAGNOSIS — R079 Chest pain, unspecified: Secondary | ICD-10-CM

## 2018-05-05 DIAGNOSIS — E782 Mixed hyperlipidemia: Secondary | ICD-10-CM | POA: Diagnosis present

## 2018-05-05 DIAGNOSIS — Z9861 Coronary angioplasty status: Secondary | ICD-10-CM | POA: Insufficient documentation

## 2018-05-05 DIAGNOSIS — I2510 Atherosclerotic heart disease of native coronary artery without angina pectoris: Secondary | ICD-10-CM | POA: Diagnosis present

## 2018-05-05 DIAGNOSIS — F4310 Post-traumatic stress disorder, unspecified: Secondary | ICD-10-CM | POA: Diagnosis present

## 2018-05-05 DIAGNOSIS — I10 Essential (primary) hypertension: Secondary | ICD-10-CM | POA: Insufficient documentation

## 2018-05-05 DIAGNOSIS — E119 Type 2 diabetes mellitus without complications: Secondary | ICD-10-CM | POA: Insufficient documentation

## 2018-05-05 LAB — MYOCARDIAL PERFUSION IMAGING
Estimated workload: 12.9 METS
Exercise duration (min): 10 min
Exercise duration (sec): 36 s
LV dias vol: 105 mL (ref 62–150)
LV sys vol: 50 mL
MPHR: 166 {beats}/min
Peak HR: 162 {beats}/min
Percent HR: 97 %
RPE: 19
Rest HR: 59 {beats}/min
SDS: 2
SRS: 0
SSS: 3
TID: 1.04

## 2018-05-05 MED ORDER — TECHNETIUM TC 99M TETROFOSMIN IV KIT
8.1000 | PACK | Freq: Once | INTRAVENOUS | Status: AC | PRN
  Administered 2018-05-05: 8.1 via INTRAVENOUS
  Filled 2018-05-05: qty 9

## 2018-05-05 MED ORDER — TECHNETIUM TC 99M TETROFOSMIN IV KIT
24.1000 | PACK | Freq: Once | INTRAVENOUS | Status: AC | PRN
  Administered 2018-05-05: 24.1 via INTRAVENOUS
  Filled 2018-05-05: qty 25

## 2018-05-20 ENCOUNTER — Ambulatory Visit: Payer: Self-pay | Admitting: Cardiology

## 2018-05-21 ENCOUNTER — Encounter: Payer: Self-pay | Admitting: *Deleted

## 2019-07-06 ENCOUNTER — Emergency Department (HOSPITAL_BASED_OUTPATIENT_CLINIC_OR_DEPARTMENT_OTHER)
Admission: EM | Admit: 2019-07-06 | Discharge: 2019-07-06 | Disposition: A | Discharge status: Home / Self Care | Payer: No Typology Code available for payment source | Attending: Emergency Medicine | Admitting: Emergency Medicine

## 2019-07-06 ENCOUNTER — Encounter (HOSPITAL_BASED_OUTPATIENT_CLINIC_OR_DEPARTMENT_OTHER): Payer: Self-pay

## 2019-07-06 ENCOUNTER — Emergency Department (HOSPITAL_BASED_OUTPATIENT_CLINIC_OR_DEPARTMENT_OTHER): Payer: No Typology Code available for payment source

## 2019-07-06 ENCOUNTER — Other Ambulatory Visit: Payer: Self-pay

## 2019-07-06 DIAGNOSIS — R079 Chest pain, unspecified: Secondary | ICD-10-CM

## 2019-07-06 DIAGNOSIS — E119 Type 2 diabetes mellitus without complications: Secondary | ICD-10-CM | POA: Insufficient documentation

## 2019-07-06 DIAGNOSIS — I252 Old myocardial infarction: Secondary | ICD-10-CM | POA: Insufficient documentation

## 2019-07-06 DIAGNOSIS — R0789 Other chest pain: Secondary | ICD-10-CM | POA: Insufficient documentation

## 2019-07-06 DIAGNOSIS — Z7984 Long term (current) use of oral hypoglycemic drugs: Secondary | ICD-10-CM | POA: Diagnosis not present

## 2019-07-06 DIAGNOSIS — Z7982 Long term (current) use of aspirin: Secondary | ICD-10-CM | POA: Insufficient documentation

## 2019-07-06 DIAGNOSIS — I2510 Atherosclerotic heart disease of native coronary artery without angina pectoris: Secondary | ICD-10-CM | POA: Insufficient documentation

## 2019-07-06 DIAGNOSIS — Z87891 Personal history of nicotine dependence: Secondary | ICD-10-CM | POA: Diagnosis not present

## 2019-07-06 DIAGNOSIS — I10 Essential (primary) hypertension: Secondary | ICD-10-CM | POA: Diagnosis not present

## 2019-07-06 DIAGNOSIS — Z79899 Other long term (current) drug therapy: Secondary | ICD-10-CM | POA: Diagnosis not present

## 2019-07-06 LAB — CBC
HCT: 43 % (ref 39.0–52.0)
Hemoglobin: 14 g/dL (ref 13.0–17.0)
MCH: 28.6 pg (ref 26.0–34.0)
MCHC: 32.6 g/dL (ref 30.0–36.0)
MCV: 87.8 fL (ref 80.0–100.0)
Platelets: 271 10*3/uL (ref 150–400)
RBC: 4.9 MIL/uL (ref 4.22–5.81)
RDW: 14.3 % (ref 11.5–15.5)
WBC: 5.2 10*3/uL (ref 4.0–10.5)
nRBC: 0 % (ref 0.0–0.2)

## 2019-07-06 LAB — BASIC METABOLIC PANEL
Anion gap: 8 (ref 5–15)
BUN: 13 mg/dL (ref 6–20)
CO2: 25 mmol/L (ref 22–32)
Calcium: 9.3 mg/dL (ref 8.9–10.3)
Chloride: 105 mmol/L (ref 98–111)
Creatinine, Ser: 0.65 mg/dL (ref 0.61–1.24)
GFR calc Af Amer: >60 mL/min (ref >60)
GFR calc non Af Amer: >60 mL/min (ref >60)
Glucose, Bld: 259 mg/dL — ABNORMAL HIGH (ref 70–99)
Potassium: 3.7 mmol/L (ref 3.5–5.1)
Sodium: 138 mmol/L (ref 135–145)

## 2019-07-06 LAB — TROPONIN I (HIGH SENSITIVITY)
Troponin I (High Sensitivity): 3 ng/L (ref <18)
Troponin I (High Sensitivity): 3 ng/L (ref <18)

## 2019-07-06 MED ORDER — METFORMIN HCL 1000 MG PO TABS: 1000.0000 mg | ORAL_TABLET | Freq: Two times a day (BID) | ORAL | 0 refills | Status: AC

## 2019-07-06 MED ORDER — SODIUM CHLORIDE 0.9% FLUSH
3.0000 mL | Freq: Once | INTRAVENOUS | Status: DC
  Filled 2019-07-06: qty 3

## 2019-07-06 NOTE — Discharge Instructions (Signed)
Follow-up with cardiologist as discussed.  Return to the emergency department if symptoms worsen.

## 2019-07-06 NOTE — ED Triage Notes (Signed)
C/PC x 1 week-today pain radiated to left arm and jaw-pain went from 5/10-0 after NTG x 2 PTA-NAD-steady gait

## 2019-07-06 NOTE — ED Provider Notes (Signed)
[facility] EMERGENCY DEPARTMENT Provider Note   CSN: [id number] Arrival date & time: 07/06/19  1405     History   Chief Complaint Chief Complaint  Patient presents with  . Chest Pain    HPI Henry Perry is a 58 y.o. male.     The history is provided by the patient.  Chest Pain Pain location:  Substernal area Pain quality: aching and dull   Pain radiates to:  L arm Pain severity:  Mild Onset quality:  Gradual Duration:  5 days Timing:  Intermittent Progression:  Waxing and waning Chronicity:  New Context: movement and at rest   Relieved by:  Nitroglycerin Worsened by:  Nothing Associated symptoms: no abdominal pain, no back pain, no cough, no fever, no palpitations, no shortness of breath and no vomiting   Risk factors: coronary artery disease     Past Medical History:  Diagnosis Date  . Acute myocardial infarction, unspecified site, episode of care unspecified   . Coronary atherosclerosis of unspecified type of vessel, native or graft    RCA DES x 3 2007  . Diabetes mellitus    non insulin dependent.   No D.R on exam 09/2009 or 09/01/11.  . Diverticulosis of colon (without mention of hemorrhage)    Admitted 01/2008  . Esophageal reflux    ED visit 10/2009 for globus pharyngeus  . Glaucoma 09/2009   Pressures normal but slight nerve thinning and corneal thickening noted on ophth exam.  . Hyperlipidemia   . 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 Hypogonadism, male 03/03/2011  . Internal hemorrhoids   . 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  MRI abd 2010 at [facility] showed benign cystic lesion.  . Obesity, unspecified   . OSA (obstructive sleep apnea) 02/2012   Mild; recommended CPAP titration/trial as of 03/22/12.  Marland Kitchen Unspecified hemorrhoids without mention of complication   . Vitamin D deficiency 05/2010   [facility]    Patient Active Problem List   Diagnosis Date Noted  . PTSD (post-traumatic stress disorder) 04/29/2018  . Atherosclerotic heart disease of native coronary artery without angina pectoris 11/09/2014  . Chest tightness 06/16/2014  . Hyperlipemia, mixed 07/12/2012  . Obstructive sleep apnea 07/12/2012  . Prostate cancer screening 01/22/2012  . OSA (obstructive sleep apnea) 01/22/2012  . Non-insulin dependent type 2 diabetes mellitus ([place]) 01/07/2011  . OBESITY 05/25/2009  . Essential hypertension 05/25/2009  . GERD 05/25/2009  . CAD S/P percutaneous coronary angioplasty 06/25/2006    Past Surgical History:  Procedure Laterality Date  . CHOLECYSTECTOMY    . COLONOSCOPY  multiple; most recent 10/28/12   Diverticulosis, moderate internal hemorrhoids.  . CORONARY ANGIOPLASTY WITH STENT PLACEMENT  2007   DES x 3 to RCA.          Home Medications    Prior to Admission medications   Medication Sig Start Date End Date Taking? Authorizing Provider  aspirin 81 MG tablet Take 81 mg by mouth daily.      [provider]  atorvastatin (LIPITOR) 80 MG tablet TAKE 1 TABLET(80 MG) BY MOUTH DAILY 06/20/16   Lelon Perla, MD  carvedilol (COREG) 25 MG tablet Take 1 tablet (25 mg total) by mouth 2 (two) times daily with a meal. 07/12/12   McGowen, Adrian Blackwater, MD  cetirizine (ZYRTEC) 10 MG tablet Take 10 mg by mouth as needed.  [provider]  hydrochlorothiazide (HYDRODIURIL) 25 MG tablet Take 1 tablet (25 mg total) by mouth daily. 05/02/13   McGowen, Adrian Blackwater, MD  lisinopril (PRINIVIL,ZESTRIL) 40 MG tablet Take 1 tablet (40 mg total) by mouth daily. 07/12/12   McGowen, Adrian Blackwater, MD  metFORMIN (GLUCOPHAGE) 1000 MG tablet Take 1,000 mg by mouth 2 (two) times daily with a meal.    [provider]  nitroGLYCERIN (NITROSTAT) 0.4 MG SL tablet Place 1 tablet (0.4 mg total) under the tongue every 5 (five) minutes as needed for chest pain. 04/29/18 07/28/18  Erlene Quan, PA-C    Family History Family  History  Problem Relation Age of Onset  . Prostate cancer Father   . Diabetes Father     Social History Social History   Tobacco Use  . Smoking status: Former Smoker    Quit date: 08/25/1990    Years since quitting: 28.8  . Smokeless tobacco: Never Used  Substance Use Topics  . Alcohol use: No  . Drug use: No     Allergies   Patient has no known allergies.   Review of Systems Review of Systems  Constitutional: Negative for chills and fever.  HENT: Negative for ear pain and sore throat.   Eyes: Negative for pain and visual disturbance.  Respiratory: Negative for cough and shortness of breath.   Cardiovascular: Positive for chest pain. Negative for palpitations.  Gastrointestinal: Negative for abdominal pain and vomiting.  Genitourinary: Negative for dysuria and hematuria.  Musculoskeletal: Negative for arthralgias and back pain.  Skin: Negative for color change and rash.  Neurological: Negative for seizures and syncope.  All other systems reviewed and are negative.    Physical Exam Updated Vital Signs BP (!) 146/76   Pulse 64   Temp 98.2 F (36.8 C) (Oral)   Resp 20   Ht 5\' 6"  (1.676 m)   Wt 91.2 kg   SpO2 99%   BMI 32.44 kg/m   Physical Exam Vitals signs and nursing note reviewed.  Constitutional:      General: He is not in acute distress.    Appearance: He is well-developed. He is not ill-appearing.  HENT:     Head: Normocephalic and atraumatic.  Eyes:     Extraocular Movements: Extraocular movements intact.     Conjunctiva/sclera: Conjunctivae normal.  Neck:     Musculoskeletal: Normal range of motion and neck supple.  Cardiovascular:     Rate and Rhythm: Normal rate and regular rhythm.     Pulses:          Radial pulses are 2+ on the right side and 2+ on the left side.       Dorsalis pedis pulses are 2+ on the right side and 2+ on the left side.     Heart sounds: Normal heart sounds. No murmur.  Pulmonary:     Effort: Pulmonary effort is  normal. No respiratory distress.     Breath sounds: Normal breath sounds. No decreased breath sounds, wheezing or rhonchi.  Chest:     Chest wall: Tenderness present.  Abdominal:     Palpations: Abdomen is soft.     Tenderness: There is no abdominal tenderness.  Musculoskeletal: Normal range of motion.     Right lower leg: No edema.     Left lower leg: No edema.  Skin:    General: Skin is warm and dry.     Capillary Refill: Capillary refill takes less than 2 seconds.  Neurological:  General: No focal deficit present.     Mental Status: He is alert.      ED Treatments / Results  Labs (all labs ordered are listed, but only abnormal results are displayed) Labs Reviewed  BASIC METABOLIC PANEL - Abnormal; Notable for the following components:      Result Value   Glucose, Bld 259 (*)    All other components within normal limits  CBC  TROPONIN I (HIGH SENSITIVITY)  TROPONIN I (HIGH SENSITIVITY)    EKG EKG Interpretation  Date/Time:  Wednesday July 06 2019 14:12:45 EST Ventricular Rate:  83 PR Interval:  176 QRS Duration: 84 QT Interval:  360 QTC Calculation: 423 R Axis:   -15 Text Interpretation: Normal sinus rhythm Normal ECG similar to prior 5/18 Confirmed by Aletta Edouard [phone]) on 07/06/2019 2:17:13 PM   Radiology Dg Chest 2 View  Result Date: 07/06/2019 CLINICAL DATA:  Chest pain to sternal pain x 1 week dull pain down left UE. HX: 4 Stents, MI 2007, HTN, Diabetes, former smokercp EXAM: CHEST - 2 VIEW COMPARISON:  01/06/2017 FINDINGS: Normal mediastinum and cardiac silhouette. Normal pulmonary vasculature. No evidence of effusion, infiltrate, or pneumothorax. No acute bony abnormality. Degenerative osteophytosis of the spine. Cholecystectomy clips. IMPRESSION: No acute cardiopulmonary process. Electronically Signed   By: Suzy Bouchard M.D.   On: 07/06/2019 14:40    Procedures Procedures (including critical care time)  Medications Ordered in ED  Medications  sodium chloride flush (NS) 0.9 % injection 3 mL (3 mLs Intravenous Not Given 07/06/19 1446)     Initial Impression / Assessment and Plan / ED Course  I have reviewed the triage vital signs and the nursing notes.  Pertinent labs & imaging results that were available during my care of the patient were reviewed by me and considered in my medical decision making (see chart for details).     Henry Perry is a 58 year old male with history of CAD, high cholesterol, hypertension who presents to the ED with chest pain.  Patient with unremarkable vitals.  No fever.  Has had some chest pain over the last several days.  States that he thinks that it is musculoskeletal in nature given that he has been playing more golf.  He has reproducible chest wall tenderness over the sternum.  He states that he had some indigestion.  He states he took nitro and thought that helped.  He does not have any active chest pain now.  He has not had any chest pain since around 6 hours ago.  EKG shows sinus rhythm.  No ischemic changes.  Troponin normal x2.  No significant anemia, electrolyte abnormality, kidney injury.  No signs of pneumonia, pneumothorax, pleural effusion on chest x-ray.  No infectious symptoms.  Overall he is very well-appearing.  He does have significant cardiac history but it appears that it may be muscular in nature.  He does have reassuring EKG and troponins.  He does have reproducible tenderness.  Chest pain has not been exertional.  Did have extensive conversation with him about observation stay for further work-up for cardiac process given his history however patient prefers to follow-up closely outpatient with his cardiologist.  I think that this is reasonable given alternative diagnosis.  He understands return precautions.  Given discharge instructions.  This chart was dictated using voice recognition software.  Despite best efforts to proofread,  errors can occur which can change the  documentation meaning.    Final Clinical Impressions(s) / ED Diagnoses   Final diagnoses:  Chest pain, unspecified type    ED Discharge Orders    None       Lennice Sites, DO 07/06/19 1727

## 2019-07-07 ENCOUNTER — Other Ambulatory Visit: Payer: Self-pay

## 2019-07-07 ENCOUNTER — Emergency Department (HOSPITAL_COMMUNITY)
Admission: EM | Admit: 2019-07-07 | Discharge: 2019-07-07 | Disposition: A | Discharge status: Home / Self Care | Payer: No Typology Code available for payment source | Attending: Emergency Medicine | Admitting: Emergency Medicine

## 2019-07-07 ENCOUNTER — Emergency Department (HOSPITAL_COMMUNITY): Payer: No Typology Code available for payment source

## 2019-07-07 ENCOUNTER — Encounter (HOSPITAL_COMMUNITY): Payer: Self-pay | Admitting: Emergency Medicine

## 2019-07-07 DIAGNOSIS — E119 Type 2 diabetes mellitus without complications: Secondary | ICD-10-CM | POA: Diagnosis not present

## 2019-07-07 DIAGNOSIS — Z79899 Other long term (current) drug therapy: Secondary | ICD-10-CM | POA: Insufficient documentation

## 2019-07-07 DIAGNOSIS — I10 Essential (primary) hypertension: Secondary | ICD-10-CM | POA: Insufficient documentation

## 2019-07-07 DIAGNOSIS — I2510 Atherosclerotic heart disease of native coronary artery without angina pectoris: Secondary | ICD-10-CM | POA: Diagnosis not present

## 2019-07-07 DIAGNOSIS — Z87891 Personal history of nicotine dependence: Secondary | ICD-10-CM | POA: Insufficient documentation

## 2019-07-07 DIAGNOSIS — K219 Gastro-esophageal reflux disease without esophagitis: Secondary | ICD-10-CM | POA: Diagnosis not present

## 2019-07-07 DIAGNOSIS — I252 Old myocardial infarction: Secondary | ICD-10-CM | POA: Insufficient documentation

## 2019-07-07 DIAGNOSIS — Z7984 Long term (current) use of oral hypoglycemic drugs: Secondary | ICD-10-CM | POA: Diagnosis not present

## 2019-07-07 DIAGNOSIS — R0789 Other chest pain: Secondary | ICD-10-CM | POA: Diagnosis present

## 2019-07-07 DIAGNOSIS — Z7982 Long term (current) use of aspirin: Secondary | ICD-10-CM | POA: Insufficient documentation

## 2019-07-07 DIAGNOSIS — R079 Chest pain, unspecified: Secondary | ICD-10-CM

## 2019-07-07 LAB — BASIC METABOLIC PANEL
Anion gap: 12 (ref 5–15)
BUN: 10 mg/dL (ref 6–20)
CO2: 25 mmol/L (ref 22–32)
Calcium: 9.5 mg/dL (ref 8.9–10.3)
Chloride: 102 mmol/L (ref 98–111)
Creatinine, Ser: 0.78 mg/dL (ref 0.61–1.24)
GFR calc Af Amer: >60 mL/min (ref >60)
GFR calc non Af Amer: >60 mL/min (ref >60)
Glucose, Bld: 155 mg/dL — ABNORMAL HIGH (ref 70–99)
Potassium: 3.6 mmol/L (ref 3.5–5.1)
Sodium: 139 mmol/L (ref 135–145)

## 2019-07-07 LAB — TROPONIN I (HIGH SENSITIVITY)
Troponin I (High Sensitivity): 3 ng/L (ref <18)
Troponin I (High Sensitivity): 3 ng/L (ref <18)

## 2019-07-07 LAB — CBC
HCT: 44.7 % (ref 39.0–52.0)
Hemoglobin: 14.1 g/dL (ref 13.0–17.0)
MCH: 28.5 pg (ref 26.0–34.0)
MCHC: 31.5 g/dL (ref 30.0–36.0)
MCV: 90.3 fL (ref 80.0–100.0)
Platelets: 294 10*3/uL (ref 150–400)
RBC: 4.95 MIL/uL (ref 4.22–5.81)
RDW: 14.4 % (ref 11.5–15.5)
WBC: 5.8 10*3/uL (ref 4.0–10.5)
nRBC: 0 % (ref 0.0–0.2)

## 2019-07-07 MED ORDER — SODIUM CHLORIDE 0.9% FLUSH
3.0000 mL | Freq: Once | INTRAVENOUS | Status: AC
  Administered 2019-07-07: 20:00:00 3 mL via INTRAVENOUS

## 2019-07-07 MED ORDER — OMEPRAZOLE 20 MG PO CPDR: 20.0000 mg | DELAYED_RELEASE_CAPSULE | Freq: Two times a day (BID) | ORAL | 0 refills | Status: DC

## 2019-07-07 MED ORDER — ASPIRIN 81 MG PO CHEW
162.0000 mg | CHEWABLE_TABLET | Freq: Once | ORAL | Status: AC
  Administered 2019-07-07: 162 mg via ORAL
  Filled 2019-07-07: qty 2

## 2019-07-07 MED ORDER — LIDOCAINE VISCOUS HCL 2 % MT SOLN
15.0000 mL | Freq: Once | OROMUCOSAL | Status: AC
  Administered 2019-07-07: 15 mL via ORAL
  Filled 2019-07-07: qty 15

## 2019-07-07 MED ORDER — ALUM & MAG HYDROXIDE-SIMETH 200-200-20 MG/5ML PO SUSP
30.0000 mL | Freq: Once | ORAL | Status: AC
  Administered 2019-07-07: 30 mL via ORAL
  Filled 2019-07-07: qty 30

## 2019-07-07 NOTE — ED Triage Notes (Addendum)
Note entered under wrong pt with last name Fambrough

## 2019-07-07 NOTE — ED Notes (Signed)
Pt very upset and states whoever took his triage did not write down the correct information. Pt complains of CP and has taken 2 nitro today.

## 2019-07-07 NOTE — Discharge Instructions (Signed)
Please follow-up with Dr. Stanford Breed as discussed with the cardiology consultant.  Your EKGs and lab work reassuring today however if you have any new or concerning symptoms please return immediately to the ED.  Have prescribed you Prilosec for reflux and attached information for your review.

## 2019-07-07 NOTE — ED Provider Notes (Signed)
[facility] EMERGENCY DEPARTMENT Provider Note   CSN: [id number] Arrival date & time: 07/07/19  1459     History   Chief Complaint Chief Complaint  Patient presents with  . Chest Pain    HPI Henry Perry is a 58 y.o. male history of obesity, HTN, CAD x4 stents '07, DM, HLD     HPI  Patient presents for 1 week of intermittent chest pressure and pain that is left-sided, sternal, described as heavy pressure and achy pain, nonradiating, nonexertional and not associated with shortness of breath, nausea, diaphoresis.  States these episodes have generally occurred at rest one episode occurred in a grocery store.  States that when he takes nitroglycerin it relieves the pain.    Patient states that he has some tenderness to touch over his right chest which he attributes to playing more golf than usual over the past several weeks.  States he also feels that his chest pressure is "gassy "and states that it sometimes is relieved when he burps.  Denies any history of reflux in the past.  States that pain has not been exertional in the past and always occurs at rest today when he walked from the ED waiting room to his room he experienced the pressure in his chest again which has persisted at a low-grade.  Patient states last nitro use was approximately 2 AM this morning when he woke up having chest pain and used heating pad, Gas-X and nitro states that pain relieved soon after nitro.  Patient denies shortness of breath, cough, pleuritic pain, nausea, diaphoresis, weakness.  Denies fever, hemoptysis, leg swelling, recent surgery, recent travel, hormone use.   Past Medical History:  Diagnosis Date  . Acute myocardial infarction, unspecified site, episode of care unspecified   . Coronary atherosclerosis of unspecified type of vessel, native or graft    RCA DES x 3 2007  . Diabetes mellitus    non insulin dependent.   No D.R on exam 09/2009 or 09/01/11.  . Diverticulosis of  colon (without mention of hemorrhage)    Admitted 01/2008  . Esophageal reflux    ED visit 10/2009 for globus pharyngeus  . Glaucoma 09/2009   Pressures normal but slight nerve thinning and corneal thickening noted on ophth exam.  . Hyperlipidemia   . 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 Hypogonadism, male 03/03/2011  . Internal hemorrhoids   . 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  MRI abd 2010 at [facility] showed benign cystic lesion.  . Obesity, unspecified   . OSA (obstructive sleep apnea) 02/2012   Mild; recommended CPAP titration/trial as of 03/22/12.  Marland Kitchen Unspecified hemorrhoids without mention of complication   . Vitamin D deficiency 05/2010   [facility]    Patient Active Problem List   Diagnosis Date Noted  . PTSD (post-traumatic stress disorder) 04/29/2018  . Atherosclerotic heart disease of native coronary artery without angina pectoris 11/09/2014  . Chest tightness 06/16/2014  . Hyperlipemia, mixed 07/12/2012  . Obstructive sleep apnea 07/12/2012  . Prostate cancer screening 01/22/2012  . OSA (obstructive sleep apnea) 01/22/2012  . Non-insulin dependent type 2 diabetes mellitus ([place]) 01/07/2011  . OBESITY 05/25/2009  . Essential hypertension 05/25/2009  . GERD 05/25/2009  . CAD S/P percutaneous coronary angioplasty 06/25/2006    Past Surgical History:  Procedure Laterality Date  . CHOLECYSTECTOMY    . COLONOSCOPY  multiple; most recent 10/28/12   Diverticulosis, moderate internal hemorrhoids.  . CORONARY ANGIOPLASTY WITH STENT PLACEMENT  2007   DES x 3 to RCA.          Home Medications    Prior to Admission medications   Medication Sig Start Date End Date Taking? Authorizing Provider  aspirin 81 MG tablet Take 81 mg by mouth daily.     Yes [provider]  atorvastatin (LIPITOR) 80 MG tablet TAKE 1 TABLET(80 MG) BY MOUTH DAILY Patient taking differently:  Take 80 mg by mouth at bedtime.  06/20/16  Yes Lelon Perla, MD  carvedilol (COREG) 25 MG tablet Take 1 tablet (25 mg total) by mouth 2 (two) times daily with a meal. 07/12/12  Yes McGowen, Adrian Blackwater, MD  cetirizine (ZYRTEC) 10 MG tablet Take 10 mg by mouth as needed.    Yes [provider]  finasteride (PROSCAR) 5 MG tablet Take 5 mg by mouth daily.   Yes [provider]  hydrochlorothiazide (HYDRODIURIL) 25 MG tablet Take 1 tablet (25 mg total) by mouth daily. 05/02/13  Yes McGowen, Adrian Blackwater, MD  ibuprofen (ADVIL) 200 MG tablet Take 400 mg by mouth every 6 (six) hours as needed for mild pain.   Yes [provider]  lisinopril (PRINIVIL,ZESTRIL) 40 MG tablet Take 1 tablet (40 mg total) by mouth daily. 07/12/12  Yes McGowen, Adrian Blackwater, MD  metFORMIN (GLUCOPHAGE) 1000 MG tablet Take 1 tablet (1,000 mg total) by mouth 2 (two) times daily with a meal. 07/06/19 08/05/19 Yes Curatolo, Adam, DO  nitroGLYCERIN (NITROSTAT) 0.4 MG SL tablet Place 1 tablet (0.4 mg total) under the tongue every 5 (five) minutes as needed for chest pain. 04/29/18 07/07/19 Yes Kilroy, Doreene Burke, PA-C  Simethicone (GAS-X PO) Take 1 tablet by mouth as needed (gas).   Yes [provider]  VITAMIN D PO Take 1 tablet by mouth daily.   Yes [provider]    Family History Family History  Problem Relation Age of Onset  . Prostate cancer Father   . Diabetes Father     Social History Social History   Tobacco Use  . Smoking status: Former Smoker    Quit date: 08/25/1990    Years since quitting: 28.8  . Smokeless tobacco: Never Used  Substance Use Topics  . Alcohol use: No  . Drug use: No     Allergies   Patient has no known allergies.   Review of Systems Review of Systems   Physical Exam Updated Vital Signs BP (!) 148/93   Pulse 64   Temp 98.3 F (36.8 C) (Oral)   Resp 18   Ht 5\' 6"  (1.676 m)   Wt 97.2 kg   SpO2 100%   BMI 34.58 kg/m   Physical Exam Vitals  signs and nursing note reviewed.  Constitutional:      General: He is not in acute distress.    Comments: Patient has no acute distress.  Appears overweight.  HENT:     Head: Normocephalic and atraumatic.     Nose: Nose normal.  Eyes:     General: No scleral icterus. Neck:     Musculoskeletal: Normal range of motion.  Cardiovascular:     Rate and Rhythm: Normal rate and regular rhythm.     Heart sounds: No murmur. No friction rub. No gallop.   Pulmonary:     Effort: Pulmonary effort is normal. No respiratory distress.     Breath sounds: No wheezing,  rhonchi or rales.  Chest:     Chest wall: No tenderness.  Abdominal:     General: Abdomen is flat. There is no distension.     Palpations: Abdomen is soft.     Tenderness: There is no abdominal tenderness.  Musculoskeletal:     Right lower leg: No edema.     Left lower leg: No edema.     Comments: Right-sided chest wall reproducible tenderness.  Skin:    General: Skin is warm and dry.  Neurological:     Mental Status: He is alert. Mental status is at baseline.  Psychiatric:        Mood and Affect: Mood normal.        Behavior: Behavior normal.      ED Treatments / Results  Labs (all labs ordered are listed, but only abnormal results are displayed) Labs Reviewed  BASIC METABOLIC PANEL - Abnormal; Notable for the following components:      Result Value   Glucose, Bld 155 (*)    All other components within normal limits  CBC  TROPONIN I (HIGH SENSITIVITY)  TROPONIN I (HIGH SENSITIVITY)    EKG EKG Interpretation  Date/Time:  Thursday July 07 2019 16:55:44 EST Ventricular Rate:  66 PR Interval:  180 QRS Duration: 84 QT Interval:  394 QTC Calculation: 413 R Axis:   -12 Text Interpretation: Normal sinus rhythm Normal ECG No significant change since last tracing Confirmed by Gareth Morgan [phone]) on 07/07/2019 7:00:14 PM   Radiology Dg Chest 2 View  Result Date: 07/07/2019 CLINICAL DATA:  Chest pain and  pressure for 1 week, dull pain radiating down LEFT arm, history coronary artery disease, diabetes mellitus, hypertension EXAM: CHEST - 2 VIEW COMPARISON:  07/06/2019 FINDINGS: Upper normal heart size. Mediastinal contours and pulmonary vascularity normal. Lungs clear. No pulmonary infiltrate, pleural effusion or pneumothorax. Bones unremarkable. Surgical clips RIGHT upper quadrant likely due to cholecystectomy. IMPRESSION: No acute abnormalities. Electronically Signed   By: Lavonia Dana M.D.   On: 07/07/2019 17:48   Dg Chest 2 View  Result Date: 07/06/2019 CLINICAL DATA:  Chest pain to sternal pain x 1 week dull pain down left UE. HX: 4 Stents, MI 2007, HTN, Diabetes, former smokercp EXAM: CHEST - 2 VIEW COMPARISON:  01/06/2017 FINDINGS: Normal mediastinum and cardiac silhouette. Normal pulmonary vasculature. No evidence of effusion, infiltrate, or pneumothorax. No acute bony abnormality. Degenerative osteophytosis of the spine. Cholecystectomy clips. IMPRESSION: No acute cardiopulmonary process. Electronically Signed   By: Suzy Bouchard M.D.   On: 07/06/2019 14:40    Procedures Procedures (including critical care time)  Medications Ordered in ED Medications  sodium chloride flush (NS) 0.9 % injection 3 mL (3 mLs Intravenous Given 07/07/19 1940)  alum & mag hydroxide-simeth (MAALOX/MYLANTA) 200-200-20 MG/5ML suspension 30 mL (30 mLs Oral Given 07/07/19 2024)    And  lidocaine (XYLOCAINE) 2 % viscous mouth solution 15 mL (15 mLs Oral Given 07/07/19 2027)  aspirin chewable tablet 162 mg (162 mg Oral Given 07/07/19 2027)     Initial Impression / Assessment and Plan / ED Course  I have reviewed the triage vital signs and the nursing notes.  Pertinent labs & imaging results that were available during my care of the patient were reviewed by me and considered in my medical decision making (see chart for details).        Patient presents with intermittent chest pain that is relieved with  nitroglycerin, occurs at rest and sometimes occasionally with exertion.  Likely  unrelated musculoskeletal right-sided chest pain that is tender to palpation.  Also with reflux symptoms.   Due to patient's elevated heart score and multiple risk factors including HLD, CAD, HTN, DM will evaluate and trend tropes and EKGs.   Unremarkable CBC and BMP.  Chest x-ray without acute finding.  Troponin stable at 3 x2 EKGs x2 are without evidence of ischemia.  However, patient has concerning, typical chest pain symptoms which may represent unstable angina will consult cardiology to discuss possibility of admission for observation and potential further cardiac work-up pending the recommendations.  On reassessment patient-patient with minimal symptoms currently 2/10.   Reevaluated patient after GI cocktail currently no symptoms.  Patient sees Dr. Stanford Breed. Unsure of how quickly he can get follow up appointment however.   On review of EMR patient had normal stress test on over 1 year ago.   05/05/18 stress test Myocardial perfusion is normal.   The study is normal.   This is a low risk study. Overall left ventricular systolic function was normal.    LV cavity size is normal. Nuclear stress EF:  52%.  The left ventricular ejection fraction is mildly decreased (45-54%).   that was performed on 07/08/2013 There are no significant changes in comparison to the prior study.    9:15 PM  discussed with cardiology will see patient at bedside.  Dispo pending recommendations.   10:17 PM discussed with cardiology after seeing patient.  Cardiology recommends the patient follow-up closely with [facility]--consult and states that she will leave a voicemail for him to receive in the morning.  Feels confident discharging patient with reassuring troponins and EKG.  Recommends Prilosec for reflux treatment.  Final Clinical Impressions(s) / ED Diagnoses   Final diagnoses:  Chest pain, unspecified type    ED Discharge Orders     None       Tedd Sias, [place] 07/07/19 2218    Gareth Morgan, MD 07/08/19 1250

## 2019-07-07 NOTE — Consult Note (Signed)
Cardiology Consultation:   Patient ID: Semaja Steffek MRN: [id number]; DOB: 1960/11/14  Admit date: 07/07/2019 Date of Consult: 07/07/2019  Primary Care Provider: [facility] Primary Cardiologist: Dr. Stanford Breed Primary Electrophysiologist:  None    Patient Profile:   Lena Santilli is a 58 y.o. male with a hx of CAD s/p RCA PCI 2007, HTN, HLD, and NIDDM who is being seen today for the evaluation of chest pain.  History of Present Illness:   Mr. Vaziri was in his usual state of health until the past week when he noticed some discomfort in his chest. He has been golfing quite a bit (18 holes three times a week) and also working at the driving range. He was concerned that he pulled a muscle with the increase in golfing as his chest has been tender to the touch. He has also had significant reflux and describes a pressure in his chest that is relieved with belching. As his symptoms persisted, he was evaluated in the ER yesterday where he had a normal ECG and two normal hsTnT and was discharged. He woke up around 1am last evening with the pain as well. He drank ginger tea, took tylenol and advil, put a hot pack on his chest and eventually took one SL nitro. After all of that, his pain resolved. He was chest pain free when he woke up and for most of the morning today. When he was in his car after the grocery store, he had similar pain that was less severe and resolved with belching. Due to concerns for heart disease, he returned to the ER for repeat evaluation where he was again found to have normal troponins and a normal ECG. On further review of his symptoms, he has been able to walk with golf and walk to his mailbox without difficulty and without reproducibility of his pain. He continues to have pain with palpation but also has separate pressure that is most reliably relieved with belching. He has taken 2 SL nitro in total this week but has always taken them in combination with other  medications. He denies any shortness of breath, nausea, lightheadedness, dizziness, palpitations, or diaphoresis.    Heart Pathway Score:     Past Medical History:  Diagnosis Date   Acute myocardial infarction, unspecified site, episode of care unspecified    Coronary atherosclerosis of unspecified type of vessel, native or graft    RCA DES x 3 2007   Diabetes mellitus    non insulin dependent.   No D.R on exam 09/2009 or 09/01/11.   Diverticulosis of colon (without mention of hemorrhage)    Admitted 01/2008   Esophageal reflux    ED visit 10/2009 for globus pharyngeus   Glaucoma 09/2009   Pressures normal but slight nerve thinning and corneal thickening noted on ophth exam.   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  MRI abd 2010 at [facility] showed benign cystic lesion.   Obesity, unspecified    OSA (obstructive sleep apnea) 02/2012   Mild; recommended CPAP titration/trial as of 03/22/12.   Unspecified hemorrhoids without mention of complication    Vitamin D deficiency 05/2010   [facility]    Past Surgical History:  Procedure Laterality Date   CHOLECYSTECTOMY     COLONOSCOPY  multiple; most recent 10/28/12   Diverticulosis, moderate internal hemorrhoids.   CORONARY ANGIOPLASTY WITH STENT PLACEMENT  2007   DES x 3 to RCA.       Home Medications:  Prior to Admission medications   Medication Sig Start Date End Date Taking? Authorizing Provider  aspirin 81 MG tablet Take 81 mg by mouth daily.     Yes [provider]  atorvastatin (LIPITOR) 80 MG tablet TAKE 1 TABLET(80 MG) BY MOUTH DAILY Patient taking differently: Take 80 mg by mouth at bedtime.  06/20/16  Yes Lelon Perla, MD  carvedilol (COREG) 25 MG tablet Take 1 tablet (25 mg total) by mouth 2  (two) times daily with a meal. 07/12/12  Yes McGowen, Adrian Blackwater, MD  cetirizine (ZYRTEC) 10 MG tablet Take 10 mg by mouth as needed.    Yes [provider]  finasteride (PROSCAR) 5 MG tablet Take 5 mg by mouth daily.   Yes [provider]  hydrochlorothiazide (HYDRODIURIL) 25 MG tablet Take 1 tablet (25 mg total) by mouth daily. 05/02/13  Yes McGowen, Adrian Blackwater, MD  ibuprofen (ADVIL) 200 MG tablet Take 400 mg by mouth every 6 (six) hours as needed for mild pain.   Yes [provider]  lisinopril (PRINIVIL,ZESTRIL) 40 MG tablet Take 1 tablet (40 mg total) by mouth daily. 07/12/12  Yes McGowen, Adrian Blackwater, MD  metFORMIN (GLUCOPHAGE) 1000 MG tablet Take 1 tablet (1,000 mg total) by mouth 2 (two) times daily with a meal. 07/06/19 08/05/19 Yes Curatolo, Adam, DO  nitroGLYCERIN (NITROSTAT) 0.4 MG SL tablet Place 1 tablet (0.4 mg total) under the tongue every 5 (five) minutes as needed for chest pain. 04/29/18 07/07/19 Yes Kilroy, Doreene Burke, PA-C  Simethicone (GAS-X PO) Take 1 tablet by mouth as needed (gas).   Yes [provider]  VITAMIN D PO Take 1 tablet by mouth daily.   Yes [provider]  omeprazole (PRILOSEC) 20 MG capsule Take 1 capsule (20 mg total) by mouth 2 (two) times daily before a meal for 10 days. 07/07/19 07/17/19  Tedd Sias, PA    Inpatient Medications: Scheduled Meds:  Continuous Infusions:  PRN Meds:   Allergies:   No Known Allergies  Social History:   Social History   Socioeconomic History   Marital status: Married    Spouse name: Not on file   Number of children: 2   Years of education: Not on file   Highest education level: Not on file  Occupational History   Occupation: IT trainer    Comment: real estate  Scientist, product/process development strain: Not on file   Food insecurity    Worry: Not on file    Inability: Not on file   Transportation needs    Medical: Not on file    Non-medical: Not on file   Tobacco Use   Smoking status: Former Smoker    Quit date: 08/25/1990    Years since quitting: 28.8   Smokeless tobacco: Never Used  Substance and Sexual Activity   Alcohol use: No   Drug use: No   Sexual activity: Not on file  Lifestyle   Physical activity    Days per week: Not on file    Minutes per session: Not on file   Stress: Not on file  Relationships   Social connections    Talks on phone: Not on file    Gets together: Not on file    Attends religious service: Not  on file    Active member of club or organization: Not on file    Attends meetings of clubs or organizations: Not on file    Relationship status: Not on file   Intimate partner violence    Fear of current or ex partner: Not on file    Emotionally abused: Not on file    Physically abused: Not on file    Forced sexual activity: Not on file  Other Topics Concern   Not on file  Social History Narrative   Occupation: Business Owner - Real Estate   Patient is a former smoker (quit about age 20).    Alcohol Use - no   Daily Caffeine Use -   Illicit Drug Use - no   Married, 1 son, 1 daughter.  Lives in [place].  Originally from [place] but lived in [place] a while before moving back to [place].   Regular exercise: just started                   Family History:   Family History  Problem Relation Age of Onset   Prostate cancer Father    Diabetes Father      ROS:  Please see the history of present illness.  All other ROS reviewed and negative.     Physical Exam/Data:   Vitals:   07/07/19 1729 07/07/19 1935 07/07/19 2130 07/07/19 2153  BP: (!) 140/94 (!) 148/93 135/84 139/90  Pulse: 63 64 61 64  Resp: 17 18 20  (!) 21  Temp:      TempSrc:      SpO2: 100% 100% 99% 100%  Weight:      Height:       No intake or output data in the 24 hours ending 07/07/19 2233 Last 3 Weights 07/07/2019 07/06/2019 05/05/2018  Weight (lbs) 214 lb 3.6 oz 201 lb 210 lb  Weight (kg) 97.173 kg 91.173 kg 95.255 kg      Body mass index is 34.58 kg/m.  General:  Well nourished, well developed, in no acute distress HEENT: normal Neck: no JVD Vascular: No carotid bruits; FA pulses 2+ bilaterally without bruits  Cardiac:  normal S1, S2; RRR; no murmurs, rubs or gallops Lungs:  clear to auscultation bilaterally, no wheezing, rhonchi or rales  Abd: soft, nontender, no hepatomegaly  Ext: no edema Musculoskeletal:  No deformities, BUE and BLE strength normal and equal Skin: warm and dry  Neuro:  CNs 2-12 intact, no focal abnormalities noted Psych:  Normal affect   EKG:  The EKG was personally reviewed and demonstrates:  Normal sinus rhythm without ST segment changes Telemetry:  Telemetry was personally reviewed and demonstrates:  Normal sinus rhythm   Relevant CV Studies: Nuclear stress test 05/05/2018  The left ventricular ejection fraction is mildly decreased (45-54%).  Nuclear stress EF: 52%.  Blood pressure demonstrated a hypertensive response to exercise.  Upsloping ST segment depression ST segment depression was noted during stress in the II, III and aVF leads, and returning to baseline after less than 1 minute of recovery.  No T wave inversion was noted during stress.  Defect 1: There is a medium defect of mild severity present in the basal inferior, mid inferior and apical inferior location. Consistent with diaphragmatic attenuation.  The study is normal.  This is a low risk study.  Laboratory Data:  High Sensitivity Troponin:   Recent Labs  Lab 07/06/19 1420 07/06/19 1607 07/07/19 1700 07/07/19 1940  TROPONINIHS 3 3 3  3  Chemistry Recent Labs  Lab 07/06/19 1420 07/07/19 1700  NA 138 139  K 3.7 3.6  CL 105 102  CO2 25 25  GLUCOSE 259* 155*  BUN 13 10  CREATININE 0.65 0.78  CALCIUM 9.3 9.5  GFRNONAA >60 >60  GFRAA >60 >60  ANIONGAP 8 12    No results for input(s): PROT, ALBUMIN, AST, ALT, ALKPHOS, BILITOT in the last 168 hours. Hematology Recent Labs  Lab  07/06/19 1420 07/07/19 1700  WBC 5.2 5.8  RBC 4.90 4.95  HGB 14.0 14.1  HCT 43.0 44.7  MCV 87.8 90.3  MCH 28.6 28.5  MCHC 32.6 31.5  RDW 14.3 14.4  PLT 271 294   BNPNo results for input(s): BNP, PROBNP in the last 168 hours.  DDimer No results for input(s): DDIMER in the last 168 hours.   Radiology/Studies:  Dg Chest 2 View  Result Date: 07/07/2019 CLINICAL DATA:  Chest pain and pressure for 1 week, dull pain radiating down LEFT arm, history coronary artery disease, diabetes mellitus, hypertension EXAM: CHEST - 2 VIEW COMPARISON:  07/06/2019 FINDINGS: Upper normal heart size. Mediastinal contours and pulmonary vascularity normal. Lungs clear. No pulmonary infiltrate, pleural effusion or pneumothorax. Bones unremarkable. Surgical clips RIGHT upper quadrant likely due to cholecystectomy. IMPRESSION: No acute abnormalities. Electronically Signed   By: Lavonia Dana M.D.   On: 07/07/2019 17:48   Dg Chest 2 View  Result Date: 07/06/2019 CLINICAL DATA:  Chest pain to sternal pain x 1 week dull pain down left UE. HX: 4 Stents, MI 2007, HTN, Diabetes, former smokercp EXAM: CHEST - 2 VIEW COMPARISON:  01/06/2017 FINDINGS: Normal mediastinum and cardiac silhouette. Normal pulmonary vasculature. No evidence of effusion, infiltrate, or pneumothorax. No acute bony abnormality. Degenerative osteophytosis of the spine. Cholecystectomy clips. IMPRESSION: No acute cardiopulmonary process. Electronically Signed   By: Suzy Bouchard M.D.   On: 07/06/2019 14:40    Assessment and Plan:   Izzak Lucken is a 58 y.o. male with a hx of CAD s/p RCA PCI 2007, HTN, HLD, and NIDDM who is being seen today for the evaluation of chest pain. His symptoms are atypical in that they do not occur with exertion and are relieved with belching. He has not had any other associated symptoms like he had with his MI in 2007 including no diaphoresis or shortness of breath. He has had two reassuring evaluations with normal ECGs  and negative hsTnT x 4 at this point. He has also had a normal stress echo a year ago. I believe his symptoms are likely related to costochondritis (the pain that is reproducible) and reflux (the pressure that is relieved with belching). He feels well and would like to go home.   - Start PPI BID for reflux - Continue conservative management of costochondritis - We will arrange follow-up with our team  - Strict return precautions including worsening chest pain, pain with exertion, or pain only relieved by nitro - Ok to discharge from a cardiac perspective     For questions or updates, please contact [facility] Please consult [email or web address] for contact info under     Signed, Princella Pellegrini, MD  07/07/2019 10:33 PM

## 2019-07-20 ENCOUNTER — Ambulatory Visit: Payer: No Typology Code available for payment source | Admitting: Cardiology

## 2019-07-20 ENCOUNTER — Telehealth: Payer: Self-pay

## 2019-07-20 NOTE — Telephone Encounter (Signed)
Left message for patient to contact office if he was interested in doing a virtual appt with Lurena Joiner on 07/26/2019 since he did not make his appt today.

## 2020-04-11 NOTE — H&P (View-Only) (Signed)
HPI: FU CAD. He underwent cardiac catheterization in 2007 secondary to a myocardial infarction. He was found to have an ejection fraction of 65%. He had a proximal 95% stenosis in his right coronary artery and the distal vessel was occluded. He subsequently underwent PCI of his right coronary artery using 3 drug-eluting stents, 2 of which were overlapping. He had nonobstructive disease in his LAD with a 50% mid and a 40% lesion further downstream. He had a 50% second diagonal as well. Last echocardiogram in January of 2008 revealed normal LV function. Patient had cardiac catheterization at the [facility] August 2021.  There was note of 70% proximal LAD, 70% mid LAD, 70% circumflex, 70% obtuse marginal, 99% mid right coronary artery.  There was note of left to right collaterals from the LAD and circumflex to the distal right coronary artery.  Viability study was recommended to see if RCA territory should be revascularized.  Since I last saw him,  patient states that since last fall he has had chest pain with more vigorous activities relieved with rest.  It is similar to his infarct pain but not as severe.  This is not worsening nor is it occurring at rest.  He denies dyspnea or syncope.  Current Outpatient Medications  Medication Sig Dispense Refill  . aspirin 81 MG tablet Take 81 mg by mouth daily.      Marland Kitchen atorvastatin (LIPITOR) 80 MG tablet TAKE 1 TABLET(80 MG) BY MOUTH DAILY (Patient taking differently: Take 80 mg by mouth at bedtime. ) 90 tablet 0  . carvedilol (COREG) 25 MG tablet Take 1 tablet (25 mg total) by mouth 2 (two) times daily with a meal. 180 tablet 4  . cetirizine (ZYRTEC) 10 MG tablet Take 10 mg by mouth as needed.     . finasteride (PROSCAR) 5 MG tablet Take 5 mg by mouth daily.    . hydrochlorothiazide (HYDRODIURIL) 25 MG tablet Take 1 tablet (25 mg total) by mouth daily. 90 tablet 1  . ibuprofen (ADVIL) 200 MG tablet Take 400 mg by mouth every 6 (six) hours as needed for mild pain.      Marland Kitchen lisinopril (PRINIVIL,ZESTRIL) 40 MG tablet Take 1 tablet (40 mg total) by mouth daily. 90 tablet 4  . metFORMIN (GLUCOPHAGE) 1000 MG tablet Take 1 tablet (1,000 mg total) by mouth 2 (two) times daily with a meal. 60 tablet 0  . nitroGLYCERIN (NITROSTAT) 0.4 MG SL tablet Place 1 tablet (0.4 mg total) under the tongue every 5 (five) minutes as needed for chest pain. 25 tablet 3  . omeprazole (PRILOSEC) 20 MG capsule Take 1 capsule (20 mg total) by mouth 2 (two) times daily before a meal for 10 days. 20 capsule 0  . Simethicone (GAS-X PO) Take 1 tablet by mouth as needed (gas).    Marland Kitchen VITAMIN D PO Take 1 tablet by mouth daily.     No current facility-administered medications for this visit.     Past Medical History:  Diagnosis Date  . Acute myocardial infarction, unspecified site, episode of care unspecified   . Coronary atherosclerosis of unspecified type of vessel, native or graft    RCA DES x 3 2007  . Diabetes mellitus    non insulin dependent.   No D.R on exam 09/2009 or 09/01/11.  . Diverticulosis of colon (without mention of hemorrhage)    Admitted 01/2008  . Esophageal reflux    ED visit 10/2009 for globus pharyngeus  . Glaucoma 09/2009  Pressures normal but slight nerve thinning and corneal thickening noted on ophth exam.  . Hyperlipidemia   . 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 Hypogonadism, male 03/03/2011  . Internal hemorrhoids   . 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  MRI abd 2010 at [facility] showed benign cystic lesion.  . Obesity, unspecified   . OSA (obstructive sleep apnea) 02/2012   Mild; recommended CPAP titration/trial as of 03/22/12.  Marland Kitchen Unspecified hemorrhoids without mention of complication   . Vitamin D deficiency 05/2010   [facility]    Past Surgical History:  Procedure Laterality Date  . CHOLECYSTECTOMY    . COLONOSCOPY  multiple; most recent 10/28/12    Diverticulosis, moderate internal hemorrhoids.  . CORONARY ANGIOPLASTY WITH STENT PLACEMENT  2007   DES x 3 to RCA.      Social History   Socioeconomic History  . Marital status: Married    Spouse name: Not on file  . Number of children: 2  . Years of education: Not on file  . Highest education level: Not on file  Occupational History  . Occupation: IT trainer    Comment: real estate  Tobacco Use  . Smoking status: Former Smoker    Quit date: 08/25/1990    Years since quitting: 29.6  . Smokeless tobacco: Never Used  Vaping Use  . Vaping Use: Never used  Substance and Sexual Activity  . Alcohol use: No  . Drug use: No  . Sexual activity: Not on file  Other Topics Concern  . 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    Alcohol Use - no   Daily Caffeine Use -   Illicit Drug Use - no   Married, 1 son, 1 daughter.  Lives in [place].  Originally from [place] but lived in [place] a while before moving back to [place].   Regular exercise: just started                  Social Determinants of Health   Financial Resource Strain:   . Difficulty of Paying Living Expenses: Not on file  Food Insecurity:   . Worried About Charity fundraiser in the Last Year: Not on file  . Ran Out of Food in the Last Year: Not on file  Transportation Needs:   . Lack of Transportation (Medical): Not on file  . Lack of Transportation (Non-Medical): Not on file  Physical Activity:   . Days of Exercise per Week: Not on file  . Minutes of Exercise per Session: Not on file  Stress:   . Feeling of Stress : Not on file  Social Connections:   . Frequency of Communication with Friends and Family: Not on file  . Frequency of Social Gatherings with Friends and Family: Not on file  . Attends Religious Services: Not on file  . Active Member of Clubs or Organizations: Not on file  . Attends Archivist  Meetings: Not on file  . Marital Status: Not on file  Intimate Partner Violence:   . Fear of Current or Ex-Partner: Not on file  . Emotionally Abused: Not on file  . Physically Abused: Not on file  . Sexually Abused: Not on file    Family History  Problem Relation Age  of Onset  . Prostate cancer Father   . Diabetes Father     ROS: no fevers or chills, productive cough, hemoptysis, dysphasia, odynophagia, melena, hematochezia, dysuria, hematuria, rash, seizure activity, orthopnea, PND, pedal edema, claudication. Remaining systems are negative.  Physical Exam: Well-developed well-nourished in no acute distress.  Skin is warm and dry.  HEENT is normal.  Neck is supple.  Chest is clear to auscultation with normal expansion.  Cardiovascular exam is regular rate and rhythm.  Abdominal exam nontender or distended. No masses palpated. Extremities show no edema. neuro grossly intact  ECG-sinus bradycardia at a rate of 57, inferior infarct.  Personally reviewed  A/P  1 coronary artery disease-Plan to continue medical therapy with aspirin and statin.  Patient underwent catheterization at the [facility] recently showing three-vessel coronary artery disease.  Most significant was RCA lesion.  Viability study was recommended prior to revascularization of the right coronary artery.  We will obtain his most recent films from the [place] to review and then make decision concerning need for PCI.  Note his angina is not progressing.  I will increase isosorbide to 30 mg daily.  2 hypertension-patient's blood pressure is controlled.  Continue present medical regimen.  3 hyperlipidemia-continue statin.  Kirk Ruths, MD  Addendum-I reviewed patient's previous cardiac catheterization films from the [place] with Dr. Fletcher Anon.  Patient has severe stenosis in the right coronary artery with slow flow, approximately 70% LAD and 70% circumflex.  There are left to right collaterals.  Dr. Fletcher Anon felt best option would be to  proceed with repeat catheterization and perform FFR on LAD lesion.  If significant patient would likely benefit from coronary artery bypass graft.  If LAD not significant by FFR then best option would likely be PCI of RCA.  I discussed this with Dr. Angelena Form and we will arrange cardiac catheterization in the near future.  I discussed this with the patient.  The risks and benefits including myocardial infarction, CVA and death discussed and he agrees to proceed.  He will need to hold his Glucophage for 48 hours following procedure.  Hold hydrochlorothiazide the day before the procedure and the day of the procedure. Kirk Ruths

## 2020-04-11 NOTE — Progress Notes (Addendum)
HPI: FU CAD. He underwent cardiac catheterization in 2007 secondary to a myocardial infarction. He was found to have an ejection fraction of 65%. He had a proximal 95% stenosis in his right coronary artery and the distal vessel was occluded. He subsequently underwent PCI of his right coronary artery using 3 drug-eluting stents, 2 of which were overlapping. He had nonobstructive disease in his LAD with a 50% mid and a 40% lesion further downstream. He had a 50% second diagonal as well. Last echocardiogram in January of 2008 revealed normal LV function. Patient had cardiac catheterization at the [facility] August 2021.  There was note of 70% proximal LAD, 70% mid LAD, 70% circumflex, 70% obtuse marginal, 99% mid right coronary artery.  There was note of left to right collaterals from the LAD and circumflex to the distal right coronary artery.  Viability study was recommended to see if RCA territory should be revascularized.  Since I last saw him,  patient states that since last fall he has had chest pain with more vigorous activities relieved with rest.  It is similar to his infarct pain but not as severe.  This is not worsening nor is it occurring at rest.  He denies dyspnea or syncope.  Current Outpatient Medications  Medication Sig Dispense Refill  . aspirin 81 MG tablet Take 81 mg by mouth daily.      Marland Kitchen atorvastatin (LIPITOR) 80 MG tablet TAKE 1 TABLET(80 MG) BY MOUTH DAILY (Patient taking differently: Take 80 mg by mouth at bedtime. ) 90 tablet 0  . carvedilol (COREG) 25 MG tablet Take 1 tablet (25 mg total) by mouth 2 (two) times daily with a meal. 180 tablet 4  . cetirizine (ZYRTEC) 10 MG tablet Take 10 mg by mouth as needed.     . finasteride (PROSCAR) 5 MG tablet Take 5 mg by mouth daily.    . hydrochlorothiazide (HYDRODIURIL) 25 MG tablet Take 1 tablet (25 mg total) by mouth daily. 90 tablet 1  . ibuprofen (ADVIL) 200 MG tablet Take 400 mg by mouth every 6 (six) hours as needed for mild pain.     Marland Kitchen lisinopril (PRINIVIL,ZESTRIL) 40 MG tablet Take 1 tablet (40 mg total) by mouth daily. 90 tablet 4  . metFORMIN (GLUCOPHAGE) 1000 MG tablet Take 1 tablet (1,000 mg total) by mouth 2 (two) times daily with a meal. 60 tablet 0  . nitroGLYCERIN (NITROSTAT) 0.4 MG SL tablet Place 1 tablet (0.4 mg total) under the tongue every 5 (five) minutes as needed for chest pain. 25 tablet 3  . omeprazole (PRILOSEC) 20 MG capsule Take 1 capsule (20 mg total) by mouth 2 (two) times daily before a meal for 10 days. 20 capsule 0  . Simethicone (GAS-X PO) Take 1 tablet by mouth as needed (gas).    Marland Kitchen VITAMIN D PO Take 1 tablet by mouth daily.     No current facility-administered medications for this visit.     Past Medical History:  Diagnosis Date  . Acute myocardial infarction, unspecified site, episode of care unspecified   . Coronary atherosclerosis of unspecified type of vessel, native or graft    RCA DES x 3 2007  . Diabetes mellitus    non insulin dependent.   No D.R on exam 09/2009 or 09/01/11.  . Diverticulosis of colon (without mention of hemorrhage)    Admitted 01/2008  . Esophageal reflux    ED visit 10/2009 for globus pharyngeus  . Glaucoma 09/2009  Pressures normal but slight nerve thinning and corneal thickening noted on ophth exam.  . Hyperlipidemia   . 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 Hypogonadism, male 03/03/2011  . Internal hemorrhoids   . 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  MRI abd 2010 at [facility] showed benign cystic lesion.  . Obesity, unspecified   . OSA (obstructive sleep apnea) 02/2012   Mild; recommended CPAP titration/trial as of 03/22/12.  Marland Kitchen Unspecified hemorrhoids without mention of complication   . Vitamin D deficiency 05/2010   [facility]    Past Surgical History:  Procedure Laterality Date  . CHOLECYSTECTOMY    . COLONOSCOPY  multiple; most recent 10/28/12    Diverticulosis, moderate internal hemorrhoids.  . CORONARY ANGIOPLASTY WITH STENT PLACEMENT  2007   DES x 3 to RCA.      Social History   Socioeconomic History  . Marital status: Married    Spouse name: Not on file  . Number of children: 2  . Years of education: Not on file  . Highest education level: Not on file  Occupational History  . Occupation: IT trainer    Comment: real estate  Tobacco Use  . Smoking status: Former Smoker    Quit date: 08/25/1990    Years since quitting: 29.6  . Smokeless tobacco: Never Used  Vaping Use  . Vaping Use: Never used  Substance and Sexual Activity  . Alcohol use: No  . Drug use: No  . Sexual activity: Not on file  Other Topics Concern  . 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    Alcohol Use - no   Daily Caffeine Use -   Illicit Drug Use - no   Married, 1 son, 1 daughter.  Lives in [place].  Originally from [place] but lived in [place] a while before moving back to [place].   Regular exercise: just started                  Social Determinants of Health   Financial Resource Strain:   . Difficulty of Paying Living Expenses: Not on file  Food Insecurity:   . Worried About Charity fundraiser in the Last Year: Not on file  . Ran Out of Food in the Last Year: Not on file  Transportation Needs:   . Lack of Transportation (Medical): Not on file  . Lack of Transportation (Non-Medical): Not on file  Physical Activity:   . Days of Exercise per Week: Not on file  . Minutes of Exercise per Session: Not on file  Stress:   . Feeling of Stress : Not on file  Social Connections:   . Frequency of Communication with Friends and Family: Not on file  . Frequency of Social Gatherings with Friends and Family: Not on file  . Attends Religious Services: Not on file  . Active Member of Clubs or Organizations: Not on file  . Attends Archivist  Meetings: Not on file  . Marital Status: Not on file  Intimate Partner Violence:   . Fear of Current or Ex-Partner: Not on file  . Emotionally Abused: Not on file  . Physically Abused: Not on file  . Sexually Abused: Not on file    Family History  Problem Relation Age  of Onset  . Prostate cancer Father   . Diabetes Father     ROS: no fevers or chills, productive cough, hemoptysis, dysphasia, odynophagia, melena, hematochezia, dysuria, hematuria, rash, seizure activity, orthopnea, PND, pedal edema, claudication. Remaining systems are negative.  Physical Exam: Well-developed well-nourished in no acute distress.  Skin is warm and dry.  HEENT is normal.  Neck is supple.  Chest is clear to auscultation with normal expansion.  Cardiovascular exam is regular rate and rhythm.  Abdominal exam nontender or distended. No masses palpated. Extremities show no edema. neuro grossly intact  ECG-sinus bradycardia at a rate of 57, inferior infarct.  Personally reviewed  A/P  1 coronary artery disease-Plan to continue medical therapy with aspirin and statin.  Patient underwent catheterization at the [facility] recently showing three-vessel coronary artery disease.  Most significant was RCA lesion.  Viability study was recommended prior to revascularization of the right coronary artery.  We will obtain his most recent films from the [place] to review and then make decision concerning need for PCI.  Note his angina is not progressing.  I will increase isosorbide to 30 mg daily.  2 hypertension-patient's blood pressure is controlled.  Continue present medical regimen.  3 hyperlipidemia-continue statin.  Kirk Ruths, MD  Addendum-I reviewed patient's previous cardiac catheterization films from the [place] with Dr. Fletcher Anon.  Patient has severe stenosis in the right coronary artery with slow flow, approximately 70% LAD and 70% circumflex.  There are left to right collaterals.  Dr. Fletcher Anon felt best option would be to  proceed with repeat catheterization and perform FFR on LAD lesion.  If significant patient would likely benefit from coronary artery bypass graft.  If LAD not significant by FFR then best option would likely be PCI of RCA.  I discussed this with Dr. Angelena Form and we will arrange cardiac catheterization in the near future.  I discussed this with the patient.  The risks and benefits including myocardial infarction, CVA and death discussed and he agrees to proceed.  He will need to hold his Glucophage for 48 hours following procedure.  Hold hydrochlorothiazide the day before the procedure and the day of the procedure. Kirk Ruths

## 2020-04-17 ENCOUNTER — Ambulatory Visit (INDEPENDENT_AMBULATORY_CARE_PROVIDER_SITE_OTHER): Payer: Non-veteran care | Admitting: Cardiology

## 2020-04-17 ENCOUNTER — Encounter: Payer: Self-pay | Admitting: Cardiology

## 2020-04-17 ENCOUNTER — Other Ambulatory Visit: Payer: Self-pay

## 2020-04-17 VITALS — BP 132/80 | HR 62

## 2020-04-17 DIAGNOSIS — I2510 Atherosclerotic heart disease of native coronary artery without angina pectoris: Secondary | ICD-10-CM | POA: Diagnosis not present

## 2020-04-17 DIAGNOSIS — R079 Chest pain, unspecified: Secondary | ICD-10-CM

## 2020-04-17 DIAGNOSIS — Z9861 Coronary angioplasty status: Secondary | ICD-10-CM | POA: Diagnosis not present

## 2020-04-17 DIAGNOSIS — E782 Mixed hyperlipidemia: Secondary | ICD-10-CM | POA: Diagnosis not present

## 2020-04-17 DIAGNOSIS — I10 Essential (primary) hypertension: Secondary | ICD-10-CM | POA: Diagnosis not present

## 2020-04-17 MED ORDER — ISOSORBIDE MONONITRATE ER 30 MG PO TB24: 30.0000 mg | ORAL_TABLET | Freq: Every day | ORAL | 3 refills | Status: AC

## 2020-04-17 NOTE — Patient Instructions (Signed)
Medication Instructions:   INCREASE ISOSORBIDE 30 MG ONCE DAILY  *If you need a refill on your cardiac medications before your next appointment, please call your pharmacy*   Lab Work: If you have labs (blood work) drawn today and your tests are completely normal, you will receive your results only by: Marland Kitchen MyChart Message (if you have MyChart) OR . A paper copy in the mail If you have any lab test that is abnormal or we need to change your treatment, we will call you to review the results.   Follow-Up: At [facility], you and your health needs are our priority.  As part of our continuing mission to provide you with exceptional heart care, we have created designated Provider Care Teams.  These Care Teams include your primary Cardiologist (physician) and Advanced Practice Providers (APPs -  Physician Assistants and Nurse Practitioners) who all work together to provide you with the care you need, when you need it.  We recommend signing up for the patient portal called "MyChart".  Sign up information is provided on this After Visit Summary.  MyChart is used to connect with patients for Virtual Visits (Telemedicine).  Patients are able to view lab/test results, encounter notes, upcoming appointments, etc.  Non-urgent messages can be sent to your provider as well.   To learn more about what you can do with MyChart, go to [email or web address].    Your next appointment:   3 month(s)  The format for your next appointment:   In Person  Provider:   Kirk Ruths, MD

## 2020-04-18 ENCOUNTER — Telehealth: Payer: Self-pay | Admitting: *Deleted

## 2020-04-18 ENCOUNTER — Encounter (HOSPITAL_BASED_OUTPATIENT_CLINIC_OR_DEPARTMENT_OTHER): Payer: Self-pay

## 2020-04-18 ENCOUNTER — Emergency Department (HOSPITAL_BASED_OUTPATIENT_CLINIC_OR_DEPARTMENT_OTHER)
Admission: EM | Admit: 2020-04-18 | Discharge: 2020-04-18 | Disposition: A | Discharge status: Home / Self Care | Payer: No Typology Code available for payment source | Attending: Emergency Medicine | Admitting: Emergency Medicine

## 2020-04-18 ENCOUNTER — Other Ambulatory Visit: Payer: Self-pay

## 2020-04-18 ENCOUNTER — Encounter: Payer: Self-pay | Admitting: *Deleted

## 2020-04-18 DIAGNOSIS — Z5321 Procedure and treatment not carried out due to patient leaving prior to being seen by health care provider: Secondary | ICD-10-CM | POA: Insufficient documentation

## 2020-04-18 DIAGNOSIS — I2510 Atherosclerotic heart disease of native coronary artery without angina pectoris: Secondary | ICD-10-CM

## 2020-04-18 DIAGNOSIS — R079 Chest pain, unspecified: Secondary | ICD-10-CM

## 2020-04-18 DIAGNOSIS — R42 Dizziness and giddiness: Secondary | ICD-10-CM | POA: Diagnosis not present

## 2020-04-18 NOTE — Telephone Encounter (Signed)
Per dr Stanford Breed patient scheduled for LC&P 05-07-20 @ 7:30 am with dr Julianne Handler. Called and discussed with the patient. Letter mailed with instructions, Lab orders mailed to the pt.

## 2020-04-18 NOTE — ED Triage Notes (Signed)
Lightheadedness that began this afternoon, checked bp 81/54 @ home, denies any sx currently, no chest pain or SOB. BP 124/87 in triage

## 2020-04-20 ENCOUNTER — Telehealth: Payer: Self-pay | Admitting: Cardiology

## 2020-04-20 NOTE — Telephone Encounter (Signed)
Attempted to call patient. Left message for patient to call back.

## 2020-04-20 NOTE — Telephone Encounter (Signed)
Patient states he dropped off an MRI at the office and he wants to make sure that Dr. Stanford Breed or his nurse have received it. Please advise.

## 2020-04-23 ENCOUNTER — Telehealth: Payer: Self-pay | Admitting: Cardiology

## 2020-04-23 NOTE — Telephone Encounter (Signed)
Patient called back again today- I tried to call him back, but left a voicemail. Will route this message to primary nurse to see if she is aware of this.  Thanks!

## 2020-04-23 NOTE — Telephone Encounter (Signed)
Left message for patient, dr Stanford Breed has reviewed everything.

## 2020-04-23 NOTE — Telephone Encounter (Signed)
Called patient back to discuss which patient information he needed.  Left call back number.

## 2020-04-23 NOTE — Telephone Encounter (Signed)
Patient called back- stating that he hand delivered the MRI information, and wants to make sure Dr.Crenshaw received it. Advised I would check with his RN.   Patient also has a question for billing regarding any auth needed for his upcoming procedure.  I advised I would send a message over to our billing department to advise on this.   Thanks!

## 2020-04-23 NOTE — Telephone Encounter (Signed)
Called patient back.  Left call back number

## 2020-04-23 NOTE — Telephone Encounter (Signed)
New message:   Patient calling to see if some information was sent to the office. Please call patient.

## 2020-04-23 NOTE — Telephone Encounter (Signed)
Patient is returning call.  °

## 2020-04-25 NOTE — Telephone Encounter (Signed)
Referred this message to staff handling [name] authorizations.  She will contact the patient.

## 2020-04-25 NOTE — Telephone Encounter (Addendum)
Spoke with pt, Aware of dr Jacalyn Lefevre recommendations.  Will forward to the pre-cert department to make sure they have gotten okay form the [place] for the procedure.

## 2020-04-25 NOTE — Telephone Encounter (Signed)
I have reviewed MRI results.  Plan is still to proceed with cardiac catheterization as scheduled. Kirk Ruths

## 2020-04-25 NOTE — Telephone Encounter (Signed)
Returned the call to the patient. He was calling to see if Dr. Stanford Breed had reviewed the MRI specifically because this stated that an "artery was open that he thought was originally blocked". He left the MRI report at the front desk on Friday.   He also wanted to know if the office had received approval from the [place] for his procedure on 9/13.

## 2020-04-25 NOTE — Telephone Encounter (Signed)
Patient returning call.

## 2020-05-02 ENCOUNTER — Telehealth: Payer: Self-pay

## 2020-05-02 NOTE — Telephone Encounter (Signed)
Pt contacted pre-catheterization scheduled at [facility] for: Monday 05/07/20 Verified arrival time and place: [facility]) at: 5:30 am    No solid food after midnight prior to cath, clear liquids until 5 AM day of procedure. CONTRAST ALLERGY: NO  AM meds can be  taken pre-cath with sips of water including: ASA 81 mg  DO NOT TAKE HCTZ THE MORNING OF THE PROCEDURE  DO NOT TAKE METFORMIN THE MORNING OF THE PROCEDURE 9/13, Tuesday 9/14 OR Wednesday 9/15, RESTART Thursday 9/16.   Confirmed patient has responsible adult to drive home post procedure and be with patient first 24 hours after arriving home:  You are allowed ONE visitor in the waiting room during the time you are at the hospital for your procedure. Both you and your visitor must wear a mask once you enter the hospital.   Pt to have covid test and labs 05/04/20.       COVID-19 Pre-Screening Questions:  . In the past 10 days have you had a new cough, shortness of breath, headache, congestion, fever (100 or greater) unexplained body aches, new sore throat, or sudden loss of taste or sense of smell? NO . In the past 10 days have you been around anyone with known Covid 19? NO . Have you been vaccinated for COVID-19? Yes 10/2019

## 2020-05-04 ENCOUNTER — Other Ambulatory Visit (HOSPITAL_COMMUNITY)
Admission: RE | Admit: 2020-05-04 | Discharge: 2020-05-04 | Disposition: A | Discharge status: Home / Self Care | Payer: No Typology Code available for payment source | Source: Ambulatory Visit | Attending: Cardiovascular Disease | Admitting: Cardiovascular Disease

## 2020-05-04 DIAGNOSIS — Z20822 Contact with and (suspected) exposure to covid-19: Secondary | ICD-10-CM | POA: Insufficient documentation

## 2020-05-04 DIAGNOSIS — Z01812 Encounter for preprocedural laboratory examination: Secondary | ICD-10-CM | POA: Insufficient documentation

## 2020-05-04 LAB — SARS CORONAVIRUS 2 (TAT 6-24 HRS): SARS Coronavirus 2: NEGATIVE

## 2020-05-05 LAB — BASIC METABOLIC PANEL
BUN/Creatinine Ratio: 9 (ref 9–20)
BUN: 9 mg/dL (ref 6–24)
CO2: 27 mmol/L (ref 20–29)
Calcium: 10.3 mg/dL — ABNORMAL HIGH (ref 8.7–10.2)
Chloride: 98 mmol/L (ref 96–106)
Creatinine, Ser: 0.96 mg/dL (ref 0.76–1.27)
GFR calc Af Amer: 102 mL/min/{1.73_m2} (ref >59)
GFR calc non Af Amer: 88 mL/min/{1.73_m2} (ref >59)
Glucose: 168 mg/dL — ABNORMAL HIGH (ref 65–99)
Potassium: 4.5 mmol/L (ref 3.5–5.2)
Sodium: 139 mmol/L (ref 134–144)

## 2020-05-05 LAB — CBC
Hematocrit: 45.3 % (ref 37.5–51.0)
Hemoglobin: 14.6 g/dL (ref 13.0–17.7)
MCH: 27.9 pg (ref 26.6–33.0)
MCHC: 32.2 g/dL (ref 31.5–35.7)
MCV: 87 fL (ref 79–97)
Platelets: 224 10*3/uL (ref 150–450)
RBC: 5.24 x10E6/uL (ref 4.14–5.80)
RDW: 13.3 % (ref 11.6–15.4)
WBC: 4.8 10*3/uL (ref 3.4–10.8)

## 2020-05-07 ENCOUNTER — Ambulatory Visit (HOSPITAL_COMMUNITY)
Admission: RE | Admit: 2020-05-07 | Discharge: 2020-05-07 | Disposition: A | Discharge status: Home / Self Care | Payer: No Typology Code available for payment source | Attending: Cardiovascular Disease | Admitting: Cardiovascular Disease

## 2020-05-07 ENCOUNTER — Ambulatory Visit (HOSPITAL_COMMUNITY)
Admission: RE | Disposition: A | Discharge status: Home / Self Care | Payer: Self-pay | Source: Home / Self Care | Attending: Cardiovascular Disease

## 2020-05-07 ENCOUNTER — Encounter (HOSPITAL_COMMUNITY): Payer: Self-pay | Admitting: Cardiovascular Disease

## 2020-05-07 DIAGNOSIS — Z87891 Personal history of nicotine dependence: Secondary | ICD-10-CM | POA: Insufficient documentation

## 2020-05-07 DIAGNOSIS — E669 Obesity, unspecified: Secondary | ICD-10-CM | POA: Insufficient documentation

## 2020-05-07 DIAGNOSIS — Z6830 Body mass index (BMI) 30.0-30.9, adult: Secondary | ICD-10-CM | POA: Insufficient documentation

## 2020-05-07 DIAGNOSIS — I25118 Atherosclerotic heart disease of native coronary artery with other forms of angina pectoris: Secondary | ICD-10-CM | POA: Diagnosis not present

## 2020-05-07 DIAGNOSIS — Z79899 Other long term (current) drug therapy: Secondary | ICD-10-CM | POA: Insufficient documentation

## 2020-05-07 DIAGNOSIS — K219 Gastro-esophageal reflux disease without esophagitis: Secondary | ICD-10-CM | POA: Insufficient documentation

## 2020-05-07 DIAGNOSIS — I252 Old myocardial infarction: Secondary | ICD-10-CM | POA: Insufficient documentation

## 2020-05-07 DIAGNOSIS — Z7982 Long term (current) use of aspirin: Secondary | ICD-10-CM | POA: Diagnosis not present

## 2020-05-07 DIAGNOSIS — G4733 Obstructive sleep apnea (adult) (pediatric): Secondary | ICD-10-CM | POA: Diagnosis not present

## 2020-05-07 DIAGNOSIS — E785 Hyperlipidemia, unspecified: Secondary | ICD-10-CM | POA: Insufficient documentation

## 2020-05-07 DIAGNOSIS — Z955 Presence of coronary angioplasty implant and graft: Secondary | ICD-10-CM | POA: Diagnosis not present

## 2020-05-07 DIAGNOSIS — I10 Essential (primary) hypertension: Secondary | ICD-10-CM | POA: Insufficient documentation

## 2020-05-07 DIAGNOSIS — E119 Type 2 diabetes mellitus without complications: Secondary | ICD-10-CM | POA: Insufficient documentation

## 2020-05-07 HISTORY — PX: INTRAVASCULAR PRESSURE WIRE/FFR STUDY: CATH118243

## 2020-05-07 HISTORY — PX: LEFT HEART CATH AND CORONARY ANGIOGRAPHY: CATH118249

## 2020-05-07 LAB — POCT ACTIVATED CLOTTING TIME: Activated Clotting Time: 357 s

## 2020-05-07 LAB — GLUCOSE, CAPILLARY: Glucose-Capillary: 134 mg/dL — ABNORMAL HIGH (ref 70–99)

## 2020-05-07 SURGERY — LEFT HEART CATH AND CORONARY ANGIOGRAPHY
Anesthesia: LOCAL

## 2020-05-07 MED ORDER — ASPIRIN 81 MG PO CHEW: 81.0000 mg | CHEWABLE_TABLET | ORAL | Status: DC

## 2020-05-07 MED ORDER — HYDRALAZINE HCL 20 MG/ML IJ SOLN: 10.0000 mg | INTRAMUSCULAR | Status: DC | PRN

## 2020-05-07 MED ORDER — SODIUM CHLORIDE 0.9% FLUSH: 3.0000 mL | Freq: Two times a day (BID) | INTRAVENOUS | Status: DC

## 2020-05-07 MED ORDER — HEPARIN (PORCINE) IN NACL 1000-0.9 UT/500ML-% IV SOLN
INTRAVENOUS | Status: AC
  Filled 2020-05-07: qty 500

## 2020-05-07 MED ORDER — ACETAMINOPHEN 325 MG PO TABS: 650.0000 mg | ORAL_TABLET | ORAL | Status: DC | PRN

## 2020-05-07 MED ORDER — SODIUM CHLORIDE 0.9 % IV SOLN: 250.0000 mL | INTRAVENOUS | Status: DC | PRN

## 2020-05-07 MED ORDER — LIDOCAINE HCL (PF) 1 % IJ SOLN
INTRAMUSCULAR | Status: DC | PRN
  Administered 2020-05-07: 2 mL

## 2020-05-07 MED ORDER — SODIUM CHLORIDE 0.9 % IV SOLN: INTRAVENOUS | Status: AC

## 2020-05-07 MED ORDER — SODIUM CHLORIDE 0.9 % WEIGHT BASED INFUSION
3.0000 mL/kg/h | INTRAVENOUS | Status: AC
  Administered 2020-05-07: 3 mL/kg/h via INTRAVENOUS

## 2020-05-07 MED ORDER — HEPARIN SODIUM (PORCINE) 1000 UNIT/ML IJ SOLN
INTRAMUSCULAR | Status: AC
  Filled 2020-05-07: qty 1

## 2020-05-07 MED ORDER — VERAPAMIL HCL 2.5 MG/ML IV SOLN
INTRAVENOUS | Status: DC | PRN
  Administered 2020-05-07: 10 mL via INTRA_ARTERIAL

## 2020-05-07 MED ORDER — SODIUM CHLORIDE 0.9% FLUSH: 3.0000 mL | INTRAVENOUS | Status: DC | PRN

## 2020-05-07 MED ORDER — SODIUM CHLORIDE 0.9 % WEIGHT BASED INFUSION: 1.0000 mL/kg/h | INTRAVENOUS | Status: DC

## 2020-05-07 MED ORDER — LABETALOL HCL 5 MG/ML IV SOLN: 10.0000 mg | INTRAVENOUS | Status: DC | PRN

## 2020-05-07 MED ORDER — HEPARIN SODIUM (PORCINE) 1000 UNIT/ML IJ SOLN
INTRAMUSCULAR | Status: DC | PRN
  Administered 2020-05-07: 10000 [IU] via INTRAVENOUS

## 2020-05-07 MED ORDER — IOHEXOL 350 MG/ML SOLN
INTRAVENOUS | Status: DC | PRN
  Administered 2020-05-07: 90 mL via INTRA_ARTERIAL

## 2020-05-07 MED ORDER — FENTANYL CITRATE (PF) 100 MCG/2ML IJ SOLN
INTRAMUSCULAR | Status: AC
  Filled 2020-05-07: qty 2

## 2020-05-07 MED ORDER — VERAPAMIL HCL 2.5 MG/ML IV SOLN
INTRAVENOUS | Status: AC
  Filled 2020-05-07: qty 2

## 2020-05-07 MED ORDER — LIDOCAINE HCL (PF) 1 % IJ SOLN
INTRAMUSCULAR | Status: AC
  Filled 2020-05-07: qty 30

## 2020-05-07 MED ORDER — FENTANYL CITRATE (PF) 100 MCG/2ML IJ SOLN
INTRAMUSCULAR | Status: DC | PRN
  Administered 2020-05-07: 50 ug via INTRAVENOUS

## 2020-05-07 MED ORDER — MIDAZOLAM HCL 2 MG/2ML IJ SOLN
INTRAMUSCULAR | Status: DC | PRN
  Administered 2020-05-07: 2 mg via INTRAVENOUS

## 2020-05-07 MED ORDER — ONDANSETRON HCL 4 MG/2ML IJ SOLN: 4.0000 mg | Freq: Four times a day (QID) | INTRAMUSCULAR | Status: DC | PRN

## 2020-05-07 MED ORDER — MIDAZOLAM HCL 2 MG/2ML IJ SOLN
INTRAMUSCULAR | Status: AC
  Filled 2020-05-07: qty 2

## 2020-05-07 SURGICAL SUPPLY — 13 items
CATH 5FR JL3.5 JR4 ANG PIG MP (CATHETERS) ×2 IMPLANT
CATH VISTA GUIDE 6FR XBLAD3.5 (CATHETERS) ×2 IMPLANT
DEVICE RAD COMP TR BAND LRG (VASCULAR PRODUCTS) ×2 IMPLANT
GLIDESHEATH SLEND SS 6F .021 (SHEATH) ×2 IMPLANT
GUIDEWIRE INQWIRE 1.5J.035X260 (WIRE) ×1 IMPLANT
GUIDEWIRE PRESSURE COMET II (WIRE) ×2 IMPLANT
INQWIRE 1.5J .035X260CM (WIRE) ×2
KIT ESSENTIALS PG (KITS) ×2 IMPLANT
KIT HEART LEFT (KITS) ×2 IMPLANT
PACK CARDIAC CATHETERIZATION (CUSTOM PROCEDURE TRAY) ×2 IMPLANT
SYR MEDRAD MARK 7 150ML (SYRINGE) ×2 IMPLANT
TRANSDUCER W/STOPCOCK (MISCELLANEOUS) ×2 IMPLANT
TUBING CIL FLEX 10 FLL-RA (TUBING) ×2 IMPLANT

## 2020-05-07 NOTE — Discharge Instructions (Signed)
DRINK PLENTY OF FLUIDS FOR THE NEXT 2-3 DAYS.  KEEP ARM ELEVATED THE REMAINDER OF THE DAY.  Radial Site Care  This sheet gives you information about how to care for yourself after your procedure. Your health care provider may also give you more specific instructions. If you have problems or questions, contact your health care provider. What can I expect after the procedure? After the procedure, it is common to have:  Bruising and tenderness at the catheter insertion area. Follow these instructions at home: Medicines  Take over-the-counter and prescription medicines only as told by your health care provider. Insertion site care 1. Follow instructions from your health care provider about how to take care of your insertion site. Make sure you: ? Wash your hands with soap and water before you change your bandage (dressing). If soap and water are not available, use hand sanitizer. ? Change your dressing as told by your health care provider. 2. Check your insertion site every day for signs of infection. Check for: ? Redness, swelling, or pain. ? Fluid or blood. ? Pus or a bad smell. ? Warmth. 3. Do not take baths, swim, or use a hot tub for 5 days. 4. You may shower 24-48 hours after the procedure. ? Remove the dressing and gently wash the site with plain soap and water. ? Pat the area dry with a clean towel. ? Do not rub the site. That could cause bleeding. 5. Do not apply powder or lotion to the site. Activity  1. For 24 hours after the procedure, or as directed by your health care provider: ? Do not flex or bend the affected arm. ? Do not push or pull heavy objects with the affected arm. ? Do not drive yourself home from the hospital or clinic. You may drive 24 hours after the procedure. ? Do not operate machinery or power tools. 2. Do not push, pull or lift anything that is heavier than 10 lb for 5 days. 3. Ask your health care provider when it is okay to: ? Return to work or  school. ? Resume usual physical activities or sports. ? Resume sexual activity. General instructions  If the catheter site starts to bleed, raise your arm and put firm pressure on the site. If the bleeding does not stop, get help right away. This is a medical emergency.  If you went home on the same day as your procedure, a responsible adult should be with you for the first 24 hours after you arrive home.  Keep all follow-up visits as told by your health care provider. This is important. Contact a health care provider if:  You have a fever.  You have redness, swelling, or yellow drainage around your insertion site. Get help right away if:  You have unusual pain at the radial site.  The catheter insertion area swells very fast.  The insertion area is bleeding, and the bleeding does not stop when you hold steady pressure on the area.  Your arm or hand becomes pale, cool, tingly, or numb. These symptoms may represent a serious problem that is an emergency. Do not wait to see if the symptoms will go away. Get medical help right away. Call your local emergency services (911 in the [place] Do not drive yourself to the hospital. Summary  After the procedure, it is common to have bruising and tenderness at the site.  Follow instructions from your health care provider about how to take care of your radial site wound. Check   the wound every day for signs of infection.  Do not push, pull or lift anything that is heavier than 10 lb for 5 days.  This information is not intended to replace advice given to you by your health care provider. Make sure you discuss any questions you have with your health care provider. Document Revised: 09/16/2017 Document Reviewed: 09/16/2017 Elsevier Patient Education  2020 Elsevier Inc. 

## 2020-05-07 NOTE — Interval H&P Note (Signed)
History and Physical Interval Note:  05/07/2020 7:30 AM  Henry Perry  has presented today for surgery, with the diagnosis of cad.  The various methods of treatment have been discussed with the patient and family. After consideration of risks, benefits and other options for treatment, the patient has consented to  Procedure(s): LEFT HEART CATH AND CORONARY ANGIOGRAPHY (N/A) as a surgical intervention.  The patient's history has been reviewed, patient examined, no change in status, stable for surgery.  I have reviewed the patient's chart and labs.  Questions were answered to the patient's satisfaction.    Cath Lab Visit (complete for each Cath Lab visit)  Clinical Evaluation Leading to the Procedure:   ACS: No.  Non-ACS:    Anginal Classification: CCS II  Anti-ischemic medical therapy: Maximal Therapy (2 or more classes of medications)  Non-Invasive Test Results: Intermediate-risk stress test findings: cardiac mortality 1-3%/year  Prior CABG: No previous CABG        Lauree Chandler

## 2020-05-08 ENCOUNTER — Encounter: Payer: Self-pay | Admitting: Thoracic Surgery (Cardiothoracic Vascular Surgery)

## 2020-05-08 ENCOUNTER — Other Ambulatory Visit: Payer: Self-pay

## 2020-05-08 ENCOUNTER — Institutional Professional Consult (permissible substitution) (INDEPENDENT_AMBULATORY_CARE_PROVIDER_SITE_OTHER): Payer: No Typology Code available for payment source | Admitting: Thoracic Surgery (Cardiothoracic Vascular Surgery)

## 2020-05-08 VITALS — BP 190/100 | HR 60 | Temp 97.7°F | Resp 20 | Ht 66.0 in | Wt 192.0 lb

## 2020-05-08 DIAGNOSIS — I2510 Atherosclerotic heart disease of native coronary artery without angina pectoris: Secondary | ICD-10-CM

## 2020-05-08 MED FILL — Heparin Sod (Porcine)-NaCl IV Soln 1000 Unit/500ML-0.9%: INTRAVENOUS | Qty: 1000 | Status: AC

## 2020-05-08 NOTE — Progress Notes (Signed)
PCP is [facility] Referring Provider is Lauree Chandler D*/ Stanford Breed, Brain  Chief Complaint  Patient presents with  . Coronary Artery Disease    Surgical consult, Cardiac Cath 05/07/2020     HPI: Henry Perry is sent for consultation regarding three-vessel coronary artery disease.  Henry Perry is a 59 year old man with known coronary disease, including an MI and DES to RCA in 2007.  His past medical history is also significant for hypertension, hyperlipidemia, type II non-insulin-dependent diabetes without complication, reflux, glaucoma, obstructive sleep apnea, vitamin D deficiency, and obesity prior to recent weight loss.  He was having problems with anginal pain beginning about 6 months ago.  That lasted through the early part of the summer.  He adopted a plant-based diet and had significant weight loss.  He also was started on isosorbide.  Between those 2 interventions his angina has essentially resolved.  He thinks it had more to do with the diet than the isosorbide.  He had a stress test at the [place] which was positive and led to cardiac catheterization in August.  He was found to have 70% stenoses in the LAD and circumflex as well as a 99% mid RCA stenosis.  There were some left to right collaterals.  He saw Dr. Stanford Breed who reviewed the films with one of his interventional colleagues.  They recommended repeat catheterization with flow assessment to help determine treatment recommendations.  Catheterization showed essentially total occlusion of the RCA with some left to right collaterals.  There was 70% stenoses in the LAD and circumflex systems.  The DFR of the proximal LAD lesion was 0.86.  He is referred for consideration for bypass surgery.   Past Medical History:  Diagnosis Date  . Acute myocardial infarction, unspecified site, episode of care unspecified   . Coronary atherosclerosis of unspecified type of vessel, native or graft    RCA DES x 3 2007  . Diabetes  mellitus    non insulin dependent.   No D.R on exam 09/2009 or 09/01/11.  . Diverticulosis of colon (without mention of hemorrhage)    Admitted 01/2008  . Esophageal reflux    ED visit 10/2009 for globus pharyngeus  . Glaucoma 09/2009   Pressures normal but slight nerve thinning and corneal thickening noted on ophth exam.  . Hyperlipidemia   . 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 Hypogonadism, male 03/03/2011  . Internal hemorrhoids   . 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  MRI abd 2010 at [facility] showed benign cystic lesion.  . Obesity, unspecified   . OSA (obstructive sleep apnea) 02/2012   Mild; recommended CPAP titration/trial as of 03/22/12.  Marland Kitchen Unspecified hemorrhoids without mention of complication   . Vitamin D deficiency 05/2010   [facility]    Past Surgical History:  Procedure Laterality Date  . CHOLECYSTECTOMY    . COLONOSCOPY  multiple; most recent 10/28/12   Diverticulosis, moderate internal hemorrhoids.  . CORONARY ANGIOPLASTY WITH STENT PLACEMENT  2007   DES x 3 to RCA.    Marland Kitchen INTRAVASCULAR PRESSURE WIRE/FFR STUDY N/A 05/07/2020   Procedure: INTRAVASCULAR PRESSURE WIRE/FFR STUDY;  Surgeon: Burnell Blanks, MD;  Location: [facility];  Service: Cardiovascular;  Laterality: N/A;  . LEFT HEART CATH AND CORONARY ANGIOGRAPHY N/A 05/07/2020   Procedure: LEFT HEART CATH AND CORONARY ANGIOGRAPHY;  Surgeon: Burnell Blanks, MD;  Location: [facility]  INVASIVE CV LAB;  Service: Cardiovascular;  Laterality: N/A;    Family History  Problem Relation Age of Onset  . Prostate cancer Father   . Diabetes Father     Social History Social History   Tobacco Use  . Smoking status: Former Smoker    Quit date: 08/25/1990    Years since quitting: 29.7  . Smokeless tobacco: Never Used  Vaping Use  . Vaping Use: Never used  Substance Use Topics  . Alcohol use: No  . Drug use: No     Current Outpatient Medications  Medication Sig Dispense Refill  . aspirin 81 MG tablet Take 81 mg by mouth daily with lunch.     Marland Kitchen atorvastatin (LIPITOR) 80 MG tablet TAKE 1 TABLET(80 MG) BY MOUTH DAILY (Patient taking differently: Take 80 mg by mouth every evening. ) 90 tablet 0  . carvedilol (COREG) 25 MG tablet Take 1 tablet (25 mg total) by mouth 2 (two) times daily with a meal. (Patient taking differently: Take 25 mg by mouth 2 (two) times daily with a meal. Lunch and evening) 180 tablet 4  . finasteride (PROSCAR) 5 MG tablet Take 5 mg by mouth daily with lunch.     . hydrochlorothiazide (HYDRODIURIL) 25 MG tablet Take 1 tablet (25 mg total) by mouth daily. 90 tablet 1  . isosorbide mononitrate (IMDUR) 30 MG 24 hr tablet Take 1 tablet (30 mg total) by mouth daily. (Patient taking differently: Take 30 mg by mouth daily with lunch. ) 90 tablet 3  . lisinopril (PRINIVIL,ZESTRIL) 40 MG tablet Take 1 tablet (40 mg total) by mouth daily. (Patient taking differently: Take 40 mg by mouth daily with lunch. ) 90 tablet 4  . Turmeric 500 MG CAPS Take 1,000 mg by mouth daily.    . vitamin B-12 (CYANOCOBALAMIN) 500 MCG tablet Take 500 mcg by mouth daily.    Marland Kitchen VITAMIN D PO Take 2,000 Units by mouth daily.     . metFORMIN (GLUCOPHAGE) 1000 MG tablet Take 1 tablet (1,000 mg total) by mouth 2 (two) times daily with a meal. (Patient taking differently: Take 500 mg by mouth every evening. ) 60 tablet 0  . nitroGLYCERIN (NITROSTAT) 0.4 MG SL tablet Place 1 tablet (0.4 mg total) under the tongue every 5 (five) minutes as needed for chest pain. 25 tablet 3  . omeprazole (PRILOSEC) 20 MG capsule Take 1 capsule (20 mg total) by mouth 2 (two) times daily before a meal for 10 days. (Patient not taking: Reported on 04/27/2020) 20 capsule 0   No current facility-administered medications for this visit.    No Known Allergies  Review of Systems  Constitutional: Negative for activity change and fatigue.       Has  lost 20 pounds after changing to weight-based diet  HENT: Negative for trouble swallowing and voice change.   Respiratory: Negative for cough, shortness of breath and wheezing.   Cardiovascular: Positive for chest pain. Negative for palpitations and leg swelling.  Gastrointestinal: Negative for abdominal distention and abdominal pain.  Endocrine: Negative for polyphagia and polyuria.  Genitourinary: Negative for difficulty urinating and dysuria.  Musculoskeletal: Negative for arthralgias and myalgias.  Neurological: Negative for syncope and weakness.  Hematological: Negative for adenopathy. Does not bruise/bleed easily.    BP (!) 190/100   Pulse 60   Temp 97.7 F (36.5 C) (Skin)   Resp 20   Ht 5\' 6"  (1.676 m)   Wt 192 lb (87.1 kg)   SpO2 100% Comment: RA  BMI 30.99 kg/m  Physical Exam Vitals reviewed.  Constitutional:      General: He is not in acute distress.    Appearance: Normal appearance.  HENT:     Head: Normocephalic and atraumatic.  Eyes:     General: No scleral icterus.    Extraocular Movements: Extraocular movements intact.  Cardiovascular:     Rate and Rhythm: Normal rate and regular rhythm.     Heart sounds: Normal heart sounds. No murmur heard.  No friction rub. No gallop.      Comments: Normal Allen's test left arm Pulmonary:     Effort: Pulmonary effort is normal. No respiratory distress.     Breath sounds: Normal breath sounds. No wheezing or rales.  Abdominal:     General: There is no distension.     Palpations: Abdomen is soft.     Tenderness: There is no abdominal tenderness.  Musculoskeletal:        General: No swelling.     Cervical back: Neck supple.  Lymphadenopathy:     Cervical: No cervical adenopathy.  Skin:    General: Skin is warm and dry.  Neurological:     General: No focal deficit present.     Mental Status: He is alert and oriented to person, place, and time.     Cranial Nerves: No cranial nerve deficit.     Motor: No weakness.     Diagnostic Tests: Cardiac catheterization Conclusion    Prox LAD lesion is 70% stenosed.  Mid LAD lesion is 50% stenosed.  3rd Mrg lesion is 70% stenosed.  Mid Cx lesion is 70% stenosed.  Prox RCA lesion is 60% stenosed.  Prox RCA to Mid RCA lesion is 100% stenosed.  Mid RCA to Dist RCA lesion is 95% stenosed.  Dist RCA lesion is 30% stenosed.  The left ventricular ejection fraction is 50-55% by visual estimate.  The left ventricular systolic function is normal.  LV end diastolic pressure is normal.  There is no mitral valve regurgitation.   1. The proximal LAD has a severe stenosis (DFR 0.86). The mid LAD has a moderate stenosis 2. The Circumflex has a severe mid stenosis and severe stenosis in OM2.  3. The RCA is a large dominant vessel with moderately severe proximal in stent restenosis, functional CTO of the mid vessel and severe disease throughout the distal vessel. The distal stent is patent. The distal branches fill from left to right collaterals.  4. Low normal LV systolic function  Recommendations: He has three vessel CAD. The RCA has moderately severe proximal in stent restenosis, functional CTO of the mid vessel and severe disease throughout the distal vessel. This vessel is not favorable for PCI. The distal branches are seen to fill from left to right collaterals and although on the smaller side, may be a target for bypass. The disease in the LAD is flow limiting by functional assessment (DFR 0.86). The disease in the Circumflex is flow limiting. The LAD and Circumflex are good targets fro bypass. I think bypass surgery is the best option. We will let him go home today since he is having no angina and will arrange an outpatient appt with CT surgery.   I personally reviewed the catheterization images and concur with the findings noted above  Impression: Hubbert Landrigan is a 59 year old gentleman with history of coronary disease dating back to an MI 18 years  ago when he was in his late 89s.  He has multiple cardiac risk factors including hypertension,  hyperlipidemia, type 2 diabetes, and previous obesity.  He started having angina about 6 months ago.  He adopted a plant-based diet and has lost a dramatic amount of weight almost 30 pounds in all the past 6 months.  He also started on Imdur.  His angina has essentially resolved at this point.  At catheterization he was found to have three-vessel coronary disease with ejection fraction of 50 to 55%.  I reviewed the catheterization images with Mr. and Mrs. Milian.  We had a long discussion about management of coronary artery disease.  Given his minimal symptomatology this would be strictly done for survival benefit in his case.  They do understand there is survival benefit with coronary bypass versus medical therapy.  I described the proposed procedure to them.  Given his young age we would recommend using a radial artery.  He has normal Allen's test on the left so that should be no problem.  I informed him of the general nature of the procedure including the need for general anesthesia, the incision to be used, the use of drains to use postoperatively, the expected hospital stay, and the overall recovery.  I informed them of the indications, risks, benefits, and alternatives.  They understand the risks include, but not limited to death, MI, DVT, PE, bleeding, possible need for transfusion, infection, cardiac arrhythmias, respiratory or renal failure, pleural effusions, as well as possibility of other unforeseeable complications.  They understand the risks and the issues that we discussed regarding survival benefit.  He wishes to take some time to think over his options before making a decision.  He will call us if he would like to proceed with surgery.  Plan: Mr. and Mrs. Arvelo will discuss there options and let us know if they would like to proceed with coronary artery bypass grafting.  Melrose Nakayama,  MD [facility] [phone]

## 2020-06-11 ENCOUNTER — Telehealth: Payer: Self-pay | Admitting: Cardiology

## 2020-06-11 DIAGNOSIS — E782 Mixed hyperlipidemia: Secondary | ICD-10-CM

## 2020-06-11 NOTE — Telephone Encounter (Signed)
Pt c/o medication issue:  1. Name of Medication:  leqvio (inclisiran)  2. How are you currently taking this medication (dosage and times per day)? Not taking  3. Are you having a reaction (difficulty breathing--STAT)? no  4. What is your medication issue? Patient's wife states they are working with a non profit to get the medication. She states it is currently in review by the FDA. She states the medication has less side effects than repatha and the patient would prefer the inclisiran.

## 2020-06-11 NOTE — Telephone Encounter (Signed)
If not approved in the [place] I cannot write a prescription for this medication.  Repatha is usually very well-tolerated.  We could certainly discontinue if any side effects occur. Henry Perry

## 2020-06-11 NOTE — Telephone Encounter (Signed)
Spoke with the patients wife at length regarding their hesitance with taking patient Repatha. She states that there are several side effects that they are concerned about. They have done their own research and found an alternative with less side effects called Leqvio (Inclisiran) which is a medication approved in [place] currently but is still being reviewed by the FDA for use in the [place].  Patient is hoping that Dr. Stanford Breed would write a Rx for the [facility] so the patient can get it shipped to him. They are willing to pay out of pocket until it has been FDA approved for [place] consumption.   I let them know that I am unsure if it would be possible for Dr. Stanford Breed to write an Rx for a medication that is not available her in the [place]. Patients wife is requesting that if they can not get a Rx for the new drug then she would like to speak with Dr. Stanford Breed about the severity of side effects for the [place].

## 2020-06-11 NOTE — Telephone Encounter (Signed)
Will forward to pharm md to call and discuss side effects with patient wife.

## 2020-06-11 NOTE — Telephone Encounter (Signed)
Spoke with patient wife (24 minutes).  Explained that we cannot write prescriptions for non FDA approved medications, and that [place] prescriptions are not valid in most of [place].  Also, inclisiran will not be a patient self-treat medication, but rather one that when approved, will be given in medical offices.    Explained difference between Repatha and inclisiran MOA, side effects of PCSK9 inhibitors and the good response we've had overall in the past 6 or so years.  We do have the option of putting him in [facility], as that trial is still open.  Wife seems to think this would be their best option.  He doesn't have any recent lipid labs in epic, care everywhere or [facility].  Wife thinks may have had done at [place], she will check.  Otherwise, he will need to have labs drawn - once we receive those, I will contact Research to see if he can qualify for the study.

## 2020-06-20 ENCOUNTER — Other Ambulatory Visit: Payer: Self-pay

## 2020-06-20 ENCOUNTER — Encounter: Payer: No Typology Code available for payment source | Admitting: *Deleted

## 2020-06-20 DIAGNOSIS — Z006 Encounter for examination for normal comparison and control in clinical research program: Secondary | ICD-10-CM

## 2020-06-20 NOTE — Research (Signed)
Mr. Schreur came to research department today to be screened for the [facility]. He did sign consent at 0702 on October 27th, 2021.   Subject Name: Henry Perry  Subject met inclusion and exclusion criteria.  The informed consent form, study requirements and expectations were reviewed with the subject and questions and concerns were addressed prior to the signing of the consent form.  The subject verbalized understanding of the trial requirements.  The subject agreed to participate in the [facility] 4 trial and signed the informed consent at 0702 on 06/20/20  The informed consent was obtained prior to performance of any protocol-specific procedures for the subject.  A copy of the signed informed consent was given to the subject and a copy was placed in the subject's medical record.   Preston Fleeting C   Mr. Montz met all criteria except for cholesterol level. When screening him and checking cholesterol it has to be at least 135 to participate in the study. Mr. Sedeno cholesterol today was <100. Did tell Mr. Svec that we could always re-screen him at a later date if his cholesterol seems to be increasing again.

## 2020-06-25 ENCOUNTER — Telehealth: Payer: Self-pay

## 2020-06-25 MED ORDER — REPATHA SURECLICK 140 MG/ML ~~LOC~~ SOAJ: 140.0000 mg | SUBCUTANEOUS | 11 refills | Status: DC

## 2020-06-25 NOTE — Telephone Encounter (Signed)
-----   Message from [place], [place] sent at 06/22/2020  1:45 PM EDT ----- Regarding: new repatha start Call pharmacy for insurance

## 2020-06-25 NOTE — Telephone Encounter (Signed)
Called and lmomed the pt that we would print rx for repatha for them to pick [facility] because [facility] directly through them

## 2020-06-25 NOTE — Telephone Encounter (Signed)
Routing to Pharmacist and MD

## 2020-06-25 NOTE — Telephone Encounter (Signed)
Patient's wife, Baxter Flattery, is following up. She states the Repatha needs to be sent to the [place]. She states she would also like a call back to further discuss this process. Please call   [place] fax #: [phone]  Authorization #: [id number]

## 2020-06-26 ENCOUNTER — Other Ambulatory Visit: Payer: Self-pay

## 2020-06-26 ENCOUNTER — Telehealth: Payer: Self-pay | Admitting: *Deleted

## 2020-06-26 DIAGNOSIS — E782 Mixed hyperlipidemia: Secondary | ICD-10-CM

## 2020-06-26 MED ORDER — PRALUENT 150 MG/ML ~~LOC~~ SOAJ: 150.0000 mg | SUBCUTANEOUS | 11 refills | Status: DC

## 2020-06-26 NOTE — Telephone Encounter (Signed)
VA requires treatment with maximum tolerated statin AND ezetimibe, and not at goal before approving PCSK-9i (Praluent is preferred).  Gave information to Hilda Blades to start patient on ezetimibe and repeat labs in 3 months.

## 2020-06-26 NOTE — Telephone Encounter (Signed)
Left message for the patient to call, according to the paperwork from the [place] regarding repatha or praluent, the patient will need to take zetia for 3 months and have repeat lab work. After the repeat labs if his LDL is still 70 to 100, only then will he qualify to get the medication from the [place].

## 2020-07-03 ENCOUNTER — Encounter: Payer: Self-pay | Admitting: *Deleted

## 2020-07-03 NOTE — Telephone Encounter (Signed)
Returned call from pt's wife (okay per DPR), who was inquiring if Repatha or praluent is available from the [facility] the pt uses. Advised pt that we would contact her with more information about that. Pt's wife verbalizes understanding.  Routing to Buyer, retail

## 2020-07-03 NOTE — Telephone Encounter (Signed)
Mychart message sent to the patient .

## 2020-07-03 NOTE — Telephone Encounter (Signed)
Patient's wife is returning call. 

## 2020-07-04 ENCOUNTER — Other Ambulatory Visit: Payer: Self-pay

## 2020-07-04 MED ORDER — REPATHA SURECLICK 140 MG/ML ~~LOC~~ SOAJ: 140.0000 mg | SUBCUTANEOUS | 11 refills | Status: DC

## 2020-07-04 NOTE — Telephone Encounter (Signed)
Called and spoke to pt stated that we needed updated labs so they agreed to send via mychart

## 2020-07-04 NOTE — Telephone Encounter (Signed)
Per pharm md, will need current insurance information to be able to do the prior authorization needed for the repatha, my chart message sent to patient requesting information.

## 2020-07-04 NOTE — Telephone Encounter (Signed)
Wife reports they have united [employer] care insurance. Will forward to pharm md to help with PA.

## 2020-07-04 NOTE — Telephone Encounter (Signed)
Called and confirmed that I received the insurance card and will be working on a prior authorization, advised that once complete I will send a mychart msg to them and instructed them to get labs post 4th dose, pt voiced understanding

## 2020-07-10 NOTE — Progress Notes (Signed)
HPI: FU CAD. Patient has had previous PCI of RCA. Last echocardiogram in January of 2008 revealed normal LV function. Cardiac catheterization September 2021 showed 70% proximal LAD, 70% third marginal, 70% circumflex, 60% proximal RCA followed by occluded proximal to mid RCA, 95% mid to distal RCA and ejection fraction 50 to 55%.  Coronary artery bypass and graft felt best option.  Patient was seen by Dr. Roxan Hockey and coronary artery bypass graft recommended for survival benefit.  However patient wanted to consider prior to proceeding and now prefers medical therapy.  Since I last saw him, he has lost over 40 pounds.  He is completely vegetarian now.  He denies dyspnea, chest pain, palpitations or syncope.  Current Outpatient Medications  Medication Sig Dispense Refill  . aspirin 81 MG tablet Take 81 mg by mouth daily with lunch.     Marland Kitchen atorvastatin (LIPITOR) 80 MG tablet TAKE 1 TABLET(80 MG) BY MOUTH DAILY (Patient taking differently: Take 80 mg by mouth every evening. ) 90 tablet 0  . carvedilol (COREG) 25 MG tablet Take 1 tablet (25 mg total) by mouth 2 (two) times daily with a meal. (Patient taking differently: Take 25 mg by mouth 2 (two) times daily with a meal. Lunch and evening) 180 tablet 4  . Evolocumab (REPATHA SURECLICK) 267 MG/ML SOAJ Inject 140 mg into the skin every 14 (fourteen) days. 2 mL 11  . finasteride (PROSCAR) 5 MG tablet Take 5 mg by mouth daily with lunch.     . hydrochlorothiazide (HYDRODIURIL) 25 MG tablet Take 1 tablet (25 mg total) by mouth daily. 90 tablet 1  . lisinopril (PRINIVIL,ZESTRIL) 40 MG tablet Take 1 tablet (40 mg total) by mouth daily. (Patient taking differently: Take 40 mg by mouth daily with lunch. ) 90 tablet 4  . metFORMIN (GLUCOPHAGE) 1000 MG tablet Take 1 tablet (1,000 mg total) by mouth 2 (two) times daily with a meal. (Patient taking differently: Take 500 mg by mouth every evening. ) 60 tablet 0  . nitroGLYCERIN (NITROSTAT) 0.4 MG SL tablet  Place 1 tablet (0.4 mg total) under the tongue every 5 (five) minutes as needed for chest pain. 25 tablet 3  . Turmeric 500 MG CAPS Take 1,000 mg by mouth daily.    . vitamin B-12 (CYANOCOBALAMIN) 500 MCG tablet Take 500 mcg by mouth daily.    Marland Kitchen VITAMIN D PO Take 2,000 Units by mouth daily.     . isosorbide mononitrate (IMDUR) 30 MG 24 hr tablet Take 1 tablet (30 mg total) by mouth daily. (Patient taking differently: Take 30 mg by mouth daily with lunch. ) 90 tablet 3   No current facility-administered medications for this visit.     Past Medical History:  Diagnosis Date  . Acute myocardial infarction, unspecified site, episode of care unspecified   . Coronary atherosclerosis of unspecified type of vessel, native or graft    RCA DES x 3 2007  . Diabetes mellitus    non insulin dependent.   No D.R on exam 09/2009 or 09/01/11.  . Diverticulosis of colon (without mention of hemorrhage)    Admitted 01/2008  . Esophageal reflux    ED visit 10/2009 for globus pharyngeus  . Glaucoma 09/2009   Pressures normal but slight nerve thinning and corneal thickening noted on ophth exam.  . Hyperlipidemia   . 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 Hypogonadism, male 03/03/2011  . Internal hemorrhoids   . 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  MRI abd 2010 at [facility] showed benign cystic lesion.  . Obesity, unspecified   . OSA (obstructive sleep apnea) 02/2012   Mild; recommended CPAP titration/trial as of 03/22/12.  Marland Kitchen Unspecified hemorrhoids without mention of complication   . Vitamin D deficiency 05/2010   [facility]    Past Surgical History:  Procedure Laterality Date  . CHOLECYSTECTOMY    . COLONOSCOPY  multiple; most recent 10/28/12   Diverticulosis, moderate internal hemorrhoids.  . CORONARY ANGIOPLASTY WITH STENT PLACEMENT  2007   DES x 3 to RCA.    Marland Kitchen INTRAVASCULAR PRESSURE WIRE/FFR STUDY N/A  05/07/2020   Procedure: INTRAVASCULAR PRESSURE WIRE/FFR STUDY;  Surgeon: Burnell Blanks, MD;  Location: [facility];  Service: Cardiovascular;  Laterality: N/A;  . LEFT HEART CATH AND CORONARY ANGIOGRAPHY N/A 05/07/2020   Procedure: LEFT HEART CATH AND CORONARY ANGIOGRAPHY;  Surgeon: Burnell Blanks, MD;  Location: [facility];  Service: Cardiovascular;  Laterality: N/A;    Social History   Socioeconomic History  . Marital status: Married    Spouse name: Not on file  . Number of children: 2  . Years of education: Not on file  . Highest education level: Not on file  Occupational History  . Occupation: IT trainer    Comment: real estate  Tobacco Use  . Smoking status: Former Smoker    Quit date: 08/25/1990    Years since quitting: 29.9  . Smokeless tobacco: Never Used  Vaping Use  . Vaping Use: Never used  Substance and Sexual Activity  . Alcohol use: No  . Drug use: No  . Sexual activity: Not on file  Other Topics Concern  . 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    Alcohol Use - no   Daily Caffeine Use -   Illicit Drug Use - no   Married, 1 son, 1 daughter.  Lives in [place].  Originally from [place] but lived in [place] a while before moving back to [place].   Regular exercise: just started                  Social Determinants of Health   Financial Resource Strain:   . Difficulty of Paying Living Expenses: Not on file  Food Insecurity:   . Worried About Charity fundraiser in the Last Year: Not on file  . Ran Out of Food in the Last Year: Not on file  Transportation Needs:   . Lack of Transportation (Medical): Not on file  . Lack of Transportation (Non-Medical): Not on file  Physical Activity:   . Days of Exercise per Week: Not on file  . Minutes of Exercise per Session: Not on file  Stress:   . Feeling of Stress : Not on file  Social  Connections:   . Frequency of Communication with Friends and Family: Not on file  . Frequency of Social Gatherings with Friends and Family: Not on file  . Attends Religious Services: Not on file  . Active Member of Clubs or Organizations: Not on file  . Attends Archivist Meetings: Not on file  . Marital Status: Not on file  Intimate Partner Violence:   . Fear of Current or Ex-Partner: Not on file  . Emotionally  Abused: Not on file  . Physically Abused: Not on file  . Sexually Abused: Not on file    Family History  Problem Relation Age of Onset  . Prostate cancer Father   . Diabetes Father     ROS: no fevers or chills, productive cough, hemoptysis, dysphasia, odynophagia, melena, hematochezia, dysuria, hematuria, rash, seizure activity, orthopnea, PND, pedal edema, claudication. Remaining systems are negative.  Physical Exam: Well-developed well-nourished in no acute distress.  Skin is warm and dry.  HEENT is normal.  Neck is supple.  Chest is clear to auscultation with normal expansion.  Cardiovascular exam is regular rate and rhythm.  Abdominal exam nontender or distended. No masses palpated. Extremities show no edema. neuro grossly intact  A/P  1 coronary artery disease-plan to continue aspirin and statin.  He has absolutely no cardiac symptoms.  We discussed the possibility of proceeding with coronary artery bypass and graft for survival benefit but he would prefer medical therapy at this point.  I have asked him to contact us if he develops any symptoms.  2 hypertension-patient's blood pressure is controlled.  Continue present medical regimen and follow.  3 hyperlipidemia-continue lipitor and Repatha.  He is scheduled for lipids and liver in approximately 8 weeks.  Kirk Ruths, MD

## 2020-07-23 ENCOUNTER — Other Ambulatory Visit: Payer: Self-pay

## 2020-07-23 ENCOUNTER — Encounter: Payer: Self-pay | Admitting: Cardiology

## 2020-07-23 ENCOUNTER — Ambulatory Visit (INDEPENDENT_AMBULATORY_CARE_PROVIDER_SITE_OTHER): Payer: No Typology Code available for payment source | Admitting: Cardiology

## 2020-07-23 VITALS — BP 128/84 | HR 64 | Ht 66.0 in | Wt 175.0 lb

## 2020-07-23 DIAGNOSIS — E782 Mixed hyperlipidemia: Secondary | ICD-10-CM

## 2020-07-23 DIAGNOSIS — I25118 Atherosclerotic heart disease of native coronary artery with other forms of angina pectoris: Secondary | ICD-10-CM | POA: Diagnosis not present

## 2020-07-23 DIAGNOSIS — I10 Essential (primary) hypertension: Secondary | ICD-10-CM

## 2020-07-23 NOTE — Patient Instructions (Signed)

## 2020-07-24 ENCOUNTER — Other Ambulatory Visit: Payer: Self-pay

## 2020-07-24 MED ORDER — CARVEDILOL 25 MG PO TABS: 25.0000 mg | ORAL_TABLET | Freq: Two times a day (BID) | ORAL | 3 refills | Status: DC

## 2020-07-24 NOTE — Telephone Encounter (Signed)
Henry Perry's daughter left message asking for a refill on carvedilol. This is Dr. Jacalyn Lefevre Henry Perry

## 2020-08-28 ENCOUNTER — Other Ambulatory Visit: Payer: Self-pay | Admitting: Cardiology

## 2020-09-03 ENCOUNTER — Other Ambulatory Visit: Payer: Self-pay | Admitting: Cardiology

## 2020-09-03 ENCOUNTER — Other Ambulatory Visit: Payer: Self-pay | Admitting: Pharmacist Clinician (PhC)/ Clinical Pharmacy Specialist

## 2020-09-03 MED ORDER — PRALUENT 150 MG/ML ~~LOC~~ SOAJ: 150.0000 mg | SUBCUTANEOUS | 12 refills | Status: DC

## 2020-09-03 NOTE — Telephone Encounter (Signed)
New insurance - praluent preferred.

## 2020-09-11 ENCOUNTER — Telehealth: Payer: Self-pay | Admitting: Cardiology

## 2020-09-11 NOTE — Telephone Encounter (Signed)
Patient calling the office for samples of medication:   1.  What medication and dosage are you requesting samples for? Henry Perry  2.  Are you currently out of this medication? yes   Patient's wife states the insurance issue has not been resolved yet. She would like to know if he can get a sample for his Saturday injection.

## 2020-09-12 ENCOUNTER — Other Ambulatory Visit: Payer: Self-pay | Admitting: Pharmacist Clinician (PhC)/ Clinical Pharmacy Specialist

## 2020-09-12 MED ORDER — REPATHA SURECLICK 140 MG/ML ~~LOC~~ SOAJ: 140.0000 mg | SUBCUTANEOUS | 0 refills | Status: DC

## 2020-09-12 NOTE — Telephone Encounter (Signed)
Responded in MyChart

## 2020-09-14 LAB — LIPID PANEL
Chol/HDL Ratio: 1.7 ratio (ref 0.0–5.0)
Cholesterol, Total: 55 mg/dL — ABNORMAL LOW (ref 100–199)
HDL: 33 mg/dL — ABNORMAL LOW (ref >39)
LDL Chol Calc (NIH): 8 mg/dL (ref 0–99)
Triglycerides: 51 mg/dL (ref 0–149)
VLDL Cholesterol Cal: 14 mg/dL (ref 5–40)

## 2020-09-14 LAB — HEPATIC FUNCTION PANEL
ALT: 38 IU/L (ref 0–44)
AST: 24 IU/L (ref 0–40)
Albumin: 4.6 g/dL (ref 3.8–4.9)
Alkaline Phosphatase: 108 IU/L (ref 44–121)
Bilirubin Total: 0.8 mg/dL (ref 0.0–1.2)
Bilirubin, Direct: 0.27 mg/dL (ref 0.00–0.40)
Total Protein: 6.9 g/dL (ref 6.0–8.5)

## 2020-10-04 ENCOUNTER — Encounter: Payer: Self-pay | Admitting: Pharmacist Clinician (PhC)/ Clinical Pharmacy Specialist

## 2020-10-05 ENCOUNTER — Other Ambulatory Visit: Payer: Self-pay | Admitting: Pharmacist Clinician (PhC)/ Clinical Pharmacy Specialist

## 2020-10-05 MED ORDER — PRALUENT 150 MG/ML ~~LOC~~ SOAJ: 150.0000 mg | SUBCUTANEOUS | 3 refills | Status: AC

## 2020-10-05 MED ORDER — PRALUENT 150 MG/ML ~~LOC~~ SOAJ: 150.0000 mg | SUBCUTANEOUS | 3 refills | Status: DC

## 2020-11-14 ENCOUNTER — Telehealth: Payer: Self-pay

## 2020-11-14 NOTE — Telephone Encounter (Signed)
leqvio benefit form mailed

## 2021-08-03 ENCOUNTER — Other Ambulatory Visit: Payer: Self-pay | Admitting: Cardiology

## 2022-01-15 ENCOUNTER — Emergency Department (HOSPITAL_BASED_OUTPATIENT_CLINIC_OR_DEPARTMENT_OTHER): Payer: No Typology Code available for payment source

## 2022-01-15 ENCOUNTER — Emergency Department (HOSPITAL_BASED_OUTPATIENT_CLINIC_OR_DEPARTMENT_OTHER)
Admission: EM | Admit: 2022-01-15 | Discharge: 2022-01-15 | Disposition: A | Discharge status: Home / Self Care | Payer: No Typology Code available for payment source | Attending: Emergency Medicine | Admitting: Emergency Medicine

## 2022-01-15 ENCOUNTER — Encounter (HOSPITAL_BASED_OUTPATIENT_CLINIC_OR_DEPARTMENT_OTHER): Payer: Self-pay | Admitting: Emergency Medicine

## 2022-01-15 DIAGNOSIS — Z7984 Long term (current) use of oral hypoglycemic drugs: Secondary | ICD-10-CM | POA: Diagnosis not present

## 2022-01-15 DIAGNOSIS — Z79899 Other long term (current) drug therapy: Secondary | ICD-10-CM | POA: Diagnosis not present

## 2022-01-15 DIAGNOSIS — Z7982 Long term (current) use of aspirin: Secondary | ICD-10-CM | POA: Insufficient documentation

## 2022-01-15 DIAGNOSIS — R519 Headache, unspecified: Secondary | ICD-10-CM

## 2022-01-15 DIAGNOSIS — I10 Essential (primary) hypertension: Secondary | ICD-10-CM

## 2022-01-15 LAB — COMPREHENSIVE METABOLIC PANEL
ALT: 46 U/L — ABNORMAL HIGH (ref 0–44)
AST: 31 U/L (ref 15–41)
Albumin: 4.1 g/dL (ref 3.5–5.0)
Alkaline Phosphatase: 83 U/L (ref 38–126)
Anion gap: 7 (ref 5–15)
BUN: 17 mg/dL (ref 6–20)
CO2: 25 mmol/L (ref 22–32)
Calcium: 8.7 mg/dL — ABNORMAL LOW (ref 8.9–10.3)
Chloride: 105 mmol/L (ref 98–111)
Creatinine, Ser: 1.06 mg/dL (ref 0.61–1.24)
GFR, Estimated: >60 mL/min (ref >60)
Glucose, Bld: 133 mg/dL — ABNORMAL HIGH (ref 70–99)
Potassium: 3.4 mmol/L — ABNORMAL LOW (ref 3.5–5.1)
Sodium: 137 mmol/L (ref 135–145)
Total Bilirubin: 0.9 mg/dL (ref 0.3–1.2)
Total Protein: 7.2 g/dL (ref 6.5–8.1)

## 2022-01-15 LAB — CBC WITH DIFFERENTIAL/PLATELET
Abs Immature Granulocytes: 0.01 10*3/uL (ref 0.00–0.07)
Basophils Absolute: 0 10*3/uL (ref 0.0–0.1)
Basophils Relative: 1 %
Eosinophils Absolute: 0.4 10*3/uL (ref 0.0–0.5)
Eosinophils Relative: 7 %
HCT: 41.1 % (ref 39.0–52.0)
Hemoglobin: 13.8 g/dL (ref 13.0–17.0)
Immature Granulocytes: 0 %
Lymphocytes Relative: 43 %
Lymphs Abs: 2.5 10*3/uL (ref 0.7–4.0)
MCH: 29.1 pg (ref 26.0–34.0)
MCHC: 33.6 g/dL (ref 30.0–36.0)
MCV: 86.7 fL (ref 80.0–100.0)
Monocytes Absolute: 0.7 10*3/uL (ref 0.1–1.0)
Monocytes Relative: 12 %
Neutro Abs: 2.2 10*3/uL (ref 1.7–7.7)
Neutrophils Relative %: 37 %
Platelets: 239 10*3/uL (ref 150–400)
RBC: 4.74 MIL/uL (ref 4.22–5.81)
RDW: 13.9 % (ref 11.5–15.5)
WBC: 5.9 10*3/uL (ref 4.0–10.5)
nRBC: 0 % (ref 0.0–0.2)

## 2022-01-15 LAB — TROPONIN I (HIGH SENSITIVITY): Troponin I (High Sensitivity): 4 ng/L (ref <18)

## 2022-01-15 MED ORDER — IBUPROFEN 800 MG PO TABS
800.0000 mg | ORAL_TABLET | Freq: Once | ORAL | Status: AC
Start: 2022-01-15 — End: 2022-01-15
  Administered 2022-01-15: 800 mg via ORAL
  Filled 2022-01-15: qty 1

## 2022-01-15 MED ORDER — ACETAMINOPHEN 500 MG PO TABS
1000.0000 mg | ORAL_TABLET | Freq: Once | ORAL | Status: AC
Start: 2022-01-15 — End: 2022-01-15
  Administered 2022-01-15: 1000 mg via ORAL
  Filled 2022-01-15: qty 2

## 2022-01-15 NOTE — ED Notes (Signed)
Pt verbalizes understanding of discharge instructions. Opportunity for questioning and answers were provided. Pt discharged from ED to home.   ? ?

## 2022-01-15 NOTE — ED Notes (Signed)
Pt tolerated MD's VO for PO challenge

## 2022-01-15 NOTE — ED Triage Notes (Signed)
Pt states he woke up this morning and felt pressure behind his eyes so he checked his blood pressure and it was elevated so he wanted to come in and have it checked

## 2022-01-15 NOTE — ED Provider Notes (Signed)
[facility] Provider Note   CSN: [id number] Arrival date & time: 01/15/22  0228     History  Chief Complaint  Patient presents with   Hypertension    Henry Perry is a 61 y.o. male.  The history is provided by the patient.  Hypertension This is a chronic problem. The current episode started more than 1 week ago. The problem occurs constantly. The problem has been gradually worsening. Associated symptoms include headaches. Pertinent negatives include no chest pain, no abdominal pain and no shortness of breath. Nothing aggravates the symptoms. Nothing relieves the symptoms. He has tried nothing for the symptoms. The treatment provided no relief.  Patient with HTN who is compliant with medication reports pressure behind eyes x 1 week and then checked BP and it was elevated.  No CP no SOB.  No neuro deficits.      Home Medications Prior to Admission medications   Medication Sig Start Date End Date Taking? Authorizing Provider  Alirocumab (PRALUENT) 150 MG/ML SOAJ Inject 150 mg into the skin every 14 (fourteen) days. 10/05/20   Lelon Perla, MD  aspirin 81 MG tablet Take 81 mg by mouth daily with lunch.     [provider]  atorvastatin (LIPITOR) 80 MG tablet TAKE 1 TABLET(80 MG) BY MOUTH DAILY Patient taking differently: Take 80 mg by mouth every evening.  06/20/16   Lelon Perla, MD  carvedilol (COREG) 25 MG tablet TAKE 1 TABLET(25 MG) BY MOUTH TWICE DAILY 08/05/21   Lelon Perla, MD  finasteride (PROSCAR) 5 MG tablet Take 5 mg by mouth daily with lunch.     [provider]  hydrochlorothiazide (HYDRODIURIL) 25 MG tablet Take 1 tablet (25 mg total) by mouth daily. 05/02/13   McGowen, Adrian Blackwater, MD  isosorbide mononitrate (IMDUR) 30 MG 24 hr tablet Take 1 tablet (30 mg total) by mouth daily. Patient taking differently: Take 30 mg by mouth daily with lunch.  04/17/20 07/16/20  Lelon Perla, MD  lisinopril  (PRINIVIL,ZESTRIL) 40 MG tablet Take 1 tablet (40 mg total) by mouth daily. Patient taking differently: Take 40 mg by mouth daily with lunch.  07/12/12   McGowen, Adrian Blackwater, MD  metFORMIN (GLUCOPHAGE) 1000 MG tablet Take 1 tablet (1,000 mg total) by mouth 2 (two) times daily with a meal. Patient taking differently: Take 500 mg by mouth every evening.  07/06/19 07/23/20  Curatolo, Adam, DO  nitroGLYCERIN (NITROSTAT) 0.4 MG SL tablet Place 1 tablet (0.4 mg total) under the tongue every 5 (five) minutes as needed for chest pain. 04/29/18 07/23/20  Erlene Quan, PA-C  Turmeric 500 MG CAPS Take 1,000 mg by mouth daily.    [provider]  vitamin B-12 (CYANOCOBALAMIN) 500 MCG tablet Take 500 mcg by mouth daily.    [provider]  VITAMIN D PO Take 2,000 Units by mouth daily.     [provider]      Allergies    Patient has no known allergies.    Review of Systems   Review of Systems  Constitutional:  Negative for fever.  HENT:  Negative for facial swelling.   Eyes:  Negative for redness and visual disturbance.  Respiratory:  Negative for shortness of breath.   Cardiovascular:  Negative for chest pain.  Gastrointestinal:  Negative for abdominal pain.  Neurological:  Positive for headaches. Negative for syncope, speech difficulty, weakness and numbness.  Psychiatric/Behavioral:  Negative for agitation.   All other systems reviewed  and are negative.  Physical Exam Updated Vital Signs BP (!) 158/95   Pulse 66   Temp 98.2 F (36.8 C) (Oral)   Resp 16   Ht '5\' 6"'$  (1.676 m)   Wt 90.7 kg   SpO2 97%   BMI 32.28 kg/m  Physical Exam Vitals and nursing note reviewed.  Constitutional:      General: He is not in acute distress.    Appearance: Normal appearance. He is well-developed. He is not diaphoretic.  HENT:     Head: Normocephalic and atraumatic.     Nose: Nose normal.     Mouth/Throat:     Mouth: Mucous membranes are moist.  Eyes:     Extraocular  Movements: Extraocular movements intact.     Conjunctiva/sclera: Conjunctivae normal.     Pupils: Pupils are equal, round, and reactive to light.  Cardiovascular:     Rate and Rhythm: Normal rate and regular rhythm.     Pulses: Normal pulses.     Heart sounds: Normal heart sounds.  Pulmonary:     Effort: Pulmonary effort is normal.     Breath sounds: Normal breath sounds. No wheezing or rales.  Abdominal:     General: Bowel sounds are normal.     Palpations: Abdomen is soft.     Tenderness: There is no abdominal tenderness. There is no guarding or rebound.  Musculoskeletal:        General: Normal range of motion.     Cervical back: Normal range of motion and neck supple.  Skin:    General: Skin is warm and dry.     Capillary Refill: Capillary refill takes less than 2 seconds.  Neurological:     General: No focal deficit present.     Mental Status: He is alert and oriented to person, place, and time.     Cranial Nerves: No cranial nerve deficit.    ED Results / Procedures / Treatments   Labs (all labs ordered are listed, but only abnormal results are displayed) Results for orders placed or performed during the hospital encounter of 01/15/22  CBC with Differential  Result Value Ref Range   WBC 5.9 4.0 - 10.5 K/uL   RBC 4.74 4.22 - 5.81 MIL/uL   Hemoglobin 13.8 13.0 - 17.0 g/dL   HCT 41.1 39.0 - 52.0 %   MCV 86.7 80.0 - 100.0 fL   MCH 29.1 26.0 - 34.0 pg   MCHC 33.6 30.0 - 36.0 g/dL   RDW 13.9 11.5 - 15.5 %   Platelets 239 150 - 400 K/uL   nRBC 0.0 0.0 - 0.2 %   Neutrophils Relative % 37 %   Neutro Abs 2.2 1.7 - 7.7 K/uL   Lymphocytes Relative 43 %   Lymphs Abs 2.5 0.7 - 4.0 K/uL   Monocytes Relative 12 %   Monocytes Absolute 0.7 0.1 - 1.0 K/uL   Eosinophils Relative 7 %   Eosinophils Absolute 0.4 0.0 - 0.5 K/uL   Basophils Relative 1 %   Basophils Absolute 0.0 0.0 - 0.1 K/uL   Immature Granulocytes 0 %   Abs Immature Granulocytes 0.01 0.00 - 0.07 K/uL   Comprehensive metabolic panel  Result Value Ref Range   Sodium 137 135 - 145 mmol/L   Potassium 3.4 (L) 3.5 - 5.1 mmol/L   Chloride 105 98 - 111 mmol/L   CO2 25 22 - 32 mmol/L   Glucose, Bld 133 (H) 70 - 99 mg/dL   BUN 17 6 - 20  mg/dL   Creatinine, Ser 1.06 0.61 - 1.24 mg/dL   Calcium 8.7 (L) 8.9 - 10.3 mg/dL   Total Protein 7.2 6.5 - 8.1 g/dL   Albumin 4.1 3.5 - 5.0 g/dL   AST 31 15 - 41 U/L   ALT 46 (H) 0 - 44 U/L   Alkaline Phosphatase 83 38 - 126 U/L   Total Bilirubin 0.9 0.3 - 1.2 mg/dL   GFR, Estimated >60 >60 mL/min   Anion gap 7 5 - 15  Troponin I (High Sensitivity)  Result Value Ref Range   Troponin I (High Sensitivity) 4 <18 ng/L   CT Head Wo Contrast  Result Date: 01/15/2022 CLINICAL DATA:  Pressure behind eyes and elevated blood pressure. EXAM: CT HEAD WITHOUT CONTRAST TECHNIQUE: Contiguous axial images were obtained from the base of the skull through the vertex without intravenous contrast. RADIATION DOSE REDUCTION: This exam was performed according to the departmental dose-optimization program which includes automated exposure control, adjustment of the mA and/or kV according to patient size and/or use of iterative reconstruction technique. COMPARISON:  May 12, 2009 FINDINGS: Brain: No evidence of acute infarction, hemorrhage, hydrocephalus, extra-axial collection or mass lesion/mass effect. Vascular: No hyperdense vessel or unexpected calcification. Skull: Normal. Negative for fracture or focal lesion. Sinuses/Orbits: There is mild bilateral ethmoid sinus mucosal thickening. Bilateral maxillary sinus polyps versus mucous retention cysts are seen. Other: None. IMPRESSION: No acute intracranial abnormality. Electronically Signed   By: Virgina Norfolk M.D.   On: 01/15/2022 03:32   DG Chest Portable 1 View  Result Date: 01/15/2022 CLINICAL DATA:  Hypertension EXAM: PORTABLE CHEST 1 VIEW COMPARISON:  07/07/2019 FINDINGS: Cardiac and mediastinal contours are within  normal limits given AP technique. No focal pulmonary opacity. No pleural effusion or pneumothorax. No acute osseous abnormality. IMPRESSION: No acute cardiopulmonary process. Electronically Signed   By: Merilyn Baba M.D.   On: 01/15/2022 03:43      EKG EKG Interpretation  Date/Time:  Wednesday Jan 15 2022 03:12:21 EDT Ventricular Rate:  72 PR Interval:  194 QRS Duration: 101 QT Interval:  402 QTC Calculation: 440 R Axis:   -8 Text Interpretation: Sinus rhythm Confirmed by Dory Horn) on 01/15/2022 3:27:34 AM  Radiology CT Head Wo Contrast  Result Date: 01/15/2022 CLINICAL DATA:  Pressure behind eyes and elevated blood pressure. EXAM: CT HEAD WITHOUT CONTRAST TECHNIQUE: Contiguous axial images were obtained from the base of the skull through the vertex without intravenous contrast. RADIATION DOSE REDUCTION: This exam was performed according to the departmental dose-optimization program which includes automated exposure control, adjustment of the mA and/or kV according to patient size and/or use of iterative reconstruction technique. COMPARISON:  May 12, 2009 FINDINGS: Brain: No evidence of acute infarction, hemorrhage, hydrocephalus, extra-axial collection or mass lesion/mass effect. Vascular: No hyperdense vessel or unexpected calcification. Skull: Normal. Negative for fracture or focal lesion. Sinuses/Orbits: There is mild bilateral ethmoid sinus mucosal thickening. Bilateral maxillary sinus polyps versus mucous retention cysts are seen. Other: None. IMPRESSION: No acute intracranial abnormality. Electronically Signed   By: Virgina Norfolk M.D.   On: 01/15/2022 03:32   DG Chest Portable 1 View  Result Date: 01/15/2022 CLINICAL DATA:  Hypertension EXAM: PORTABLE CHEST 1 VIEW COMPARISON:  07/07/2019 FINDINGS: Cardiac and mediastinal contours are within normal limits given AP technique. No focal pulmonary opacity. No pleural effusion or pneumothorax. No acute osseous  abnormality. IMPRESSION: No acute cardiopulmonary process. Electronically Signed   By: Merilyn Baba M.D.   On: 01/15/2022 03:43  Procedures Procedures    Medications Ordered in ED Medications  acetaminophen (TYLENOL) tablet 1,000 mg (1,000 mg Oral Given 01/15/22 0348)  ibuprofen (ADVIL) tablet 800 mg (800 mg Oral Given 01/15/22 4315)    ED Course/ Medical Decision Making/ A&P                           Medical Decision Making HTN worsening and normal with pressure behind the eyes   Amount and/or Complexity of Data Reviewed External Data Reviewed: notes.    Details: previous notes reviewed Labs: ordered.    Details: all labs reviewed by me: Normal CBC, white count is normal at 5.9 and normal hemoglobin 13.8, platelets 239,000. normal troponin in the setting of a weeks worth of headache.  Normal sodium and chloride.  Normal BUN 17 and creatinine 1.06 Radiology: ordered.    Details: CT and chest XRay normal by my review ECG/medicine tests: ordered and independent interpretation performed. Decision-making details documented in ED Course.  Risk OTC drugs. Prescription drug management. Risk Details: Pain is improved in the ED.  Negative head CT and BP came down on it's own by 50 points.  I would not intervene on this BP. Ruled out for MI based on time course of symptoms in the ED.  I have advised patient to discuss elevated BPS with his PMD.  Strict return precautions given.      Final Clinical Impression(s) / ED Diagnoses Final diagnoses:  Primary hypertension  Headache disorder   Return for intractable cough, coughing up blood, fevers > 100.4 unrelieved by medication, shortness of breath, intractable vomiting, chest pain, shortness of breath, weakness, numbness, changes in speech, facial asymmetry, abdominal pain, passing out, Inability to tolerate liquids or food, cough, altered mental status or any concerns. No signs of systemic illness or infection. The patient is  nontoxic-appearing on exam and vital signs are within normal limits.  I have reviewed the triage vital signs and the nursing notes. Pertinent labs & imaging results that were available during my care of the patient were reviewed by me and considered in my medical decision making (see chart for details). After history, exam, and medical workup I feel the patient has been appropriately medically screened and is safe for discharge home. Pertinent diagnoses were discussed with the patient. Patient was given return precautions.  Rx / DC Orders ED Discharge Orders     None         Rayn Enderson, MD 01/15/22 [id number]

## 2022-08-13 NOTE — Progress Notes (Signed)
This encounter was created in error - please disregard.

## 2022-08-16 ENCOUNTER — Other Ambulatory Visit: Payer: Self-pay | Admitting: Cardiology

## 2022-08-27 ENCOUNTER — Other Ambulatory Visit: Payer: Self-pay | Admitting: Cardiology

## 2022-10-01 ENCOUNTER — Other Ambulatory Visit (HOSPITAL_COMMUNITY): Payer: Self-pay | Admitting: Nurse Practitioner

## 2022-10-01 DIAGNOSIS — R972 Elevated prostate specific antigen [PSA]: Secondary | ICD-10-CM

## 2022-10-15 ENCOUNTER — Ambulatory Visit (HOSPITAL_COMMUNITY)
Admission: RE | Admit: 2022-10-15 | Discharge: 2022-10-15 | Disposition: A | Discharge status: Home / Self Care | Payer: No Typology Code available for payment source | Source: Ambulatory Visit | Attending: Nurse Practitioner | Admitting: Nurse Practitioner

## 2022-10-15 DIAGNOSIS — R972 Elevated prostate specific antigen [PSA]: Secondary | ICD-10-CM | POA: Diagnosis present

## 2022-10-15 MED ORDER — GADOBUTROL 1 MMOL/ML IV SOLN
10.0000 mL | Freq: Once | INTRAVENOUS | Status: AC | PRN
  Administered 2022-10-15: 10 mL via INTRAVENOUS

## 2022-10-27 IMAGING — CT CT HEAD W/O CM
3 series · 15 of 47 positions shown, 18 images · non-contrast
Comparison: May 12, 2009

CLINICAL DATA: Pressure behind eyes and elevated blood pressure.



[Series 2: head wo · axial · 0.45mm/px · z∈[-74,+62]mm · 9 of 33 slices shown, 12 images]
[im 3/33  brain]
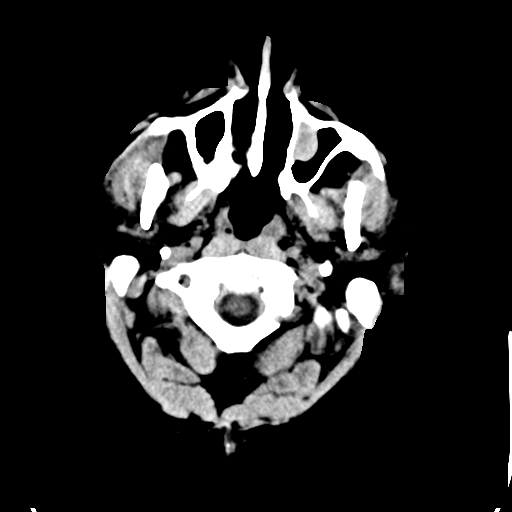
[im 3/33  bone]
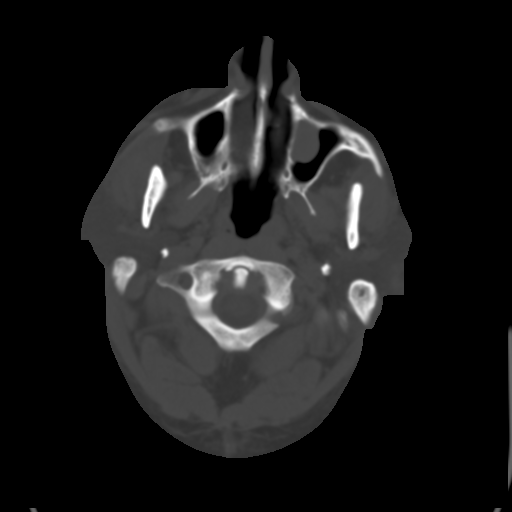
[im 6/33  brain]
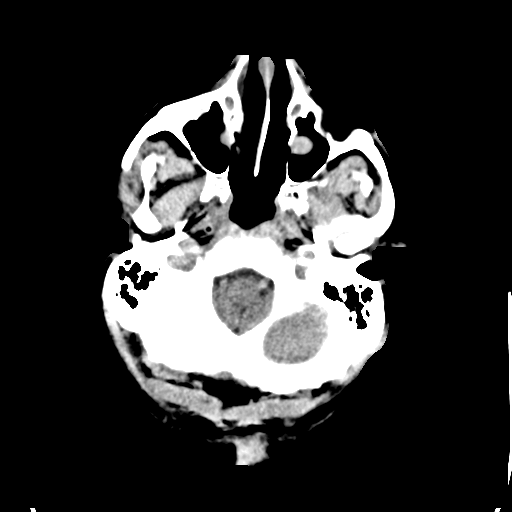
[im 9/33  brain]
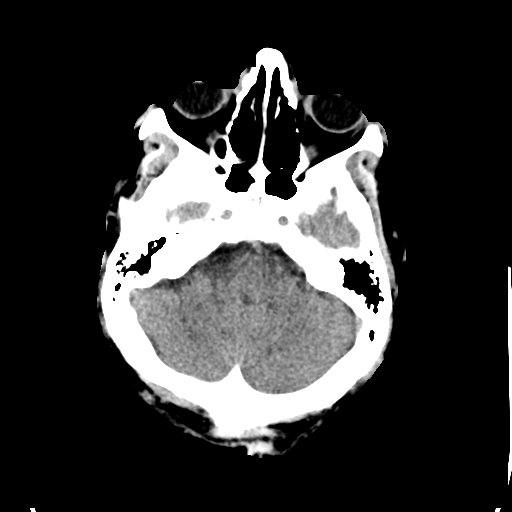
[im 13/33  brain]
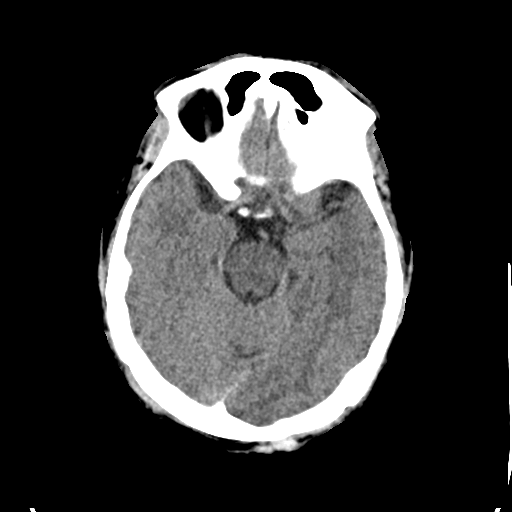
[im 17/33  brain]
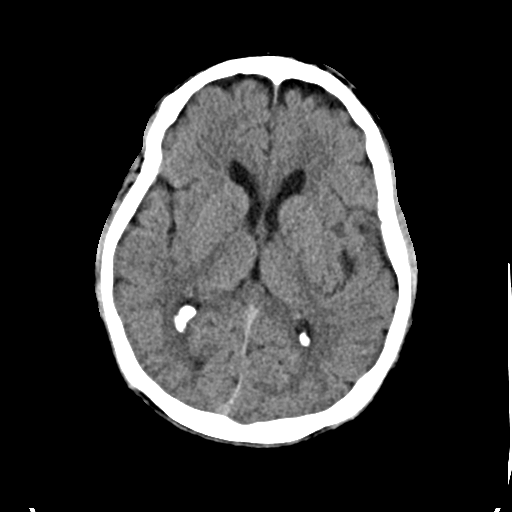
[im 17/33  bone]
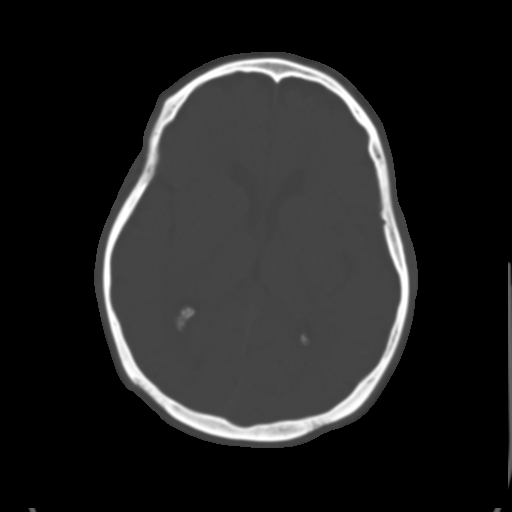
[im 20/33  brain]
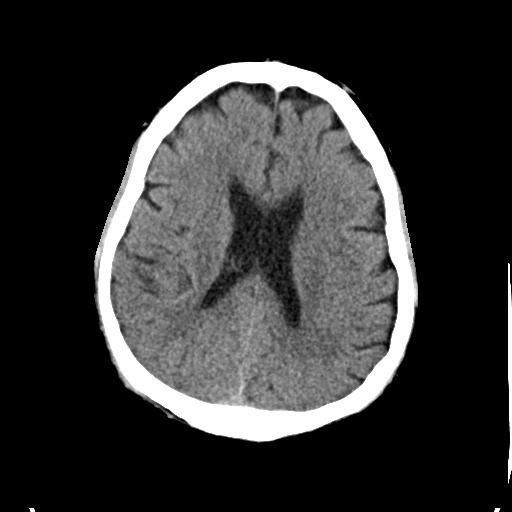
[im 24/33  brain]
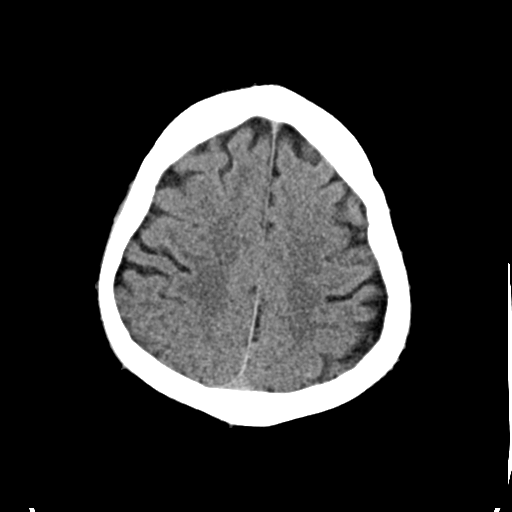
[im 27/33  brain]
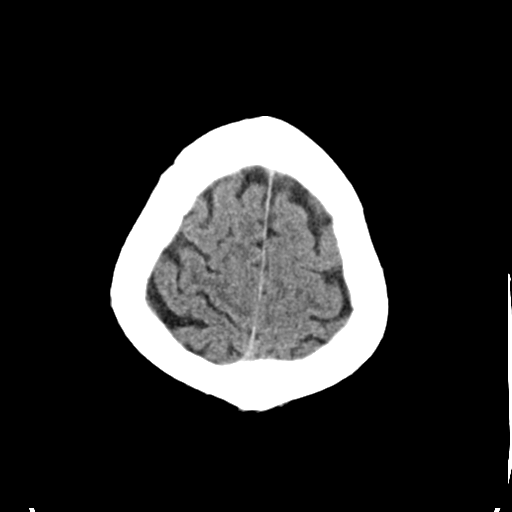
[im 30/33  brain]
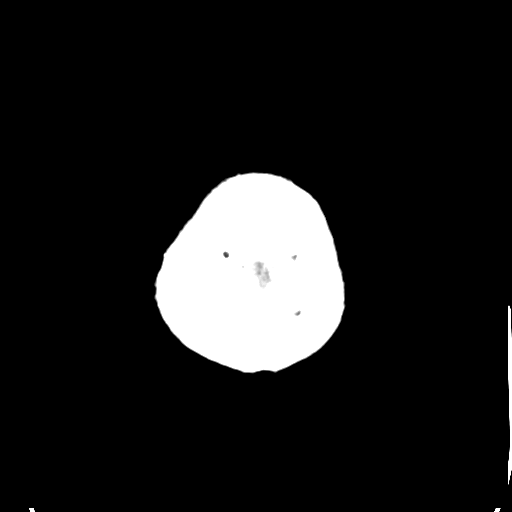
[im 30/33  bone]
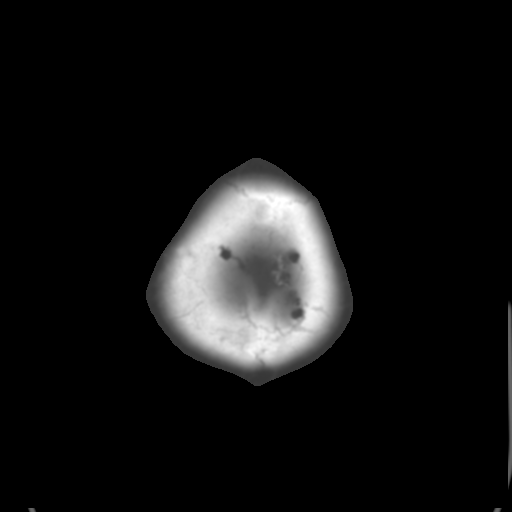

[Series 4: coronal soft · coronal · 0.32mm/px · 3 of 74 slices shown]
[im 25/74  brain]
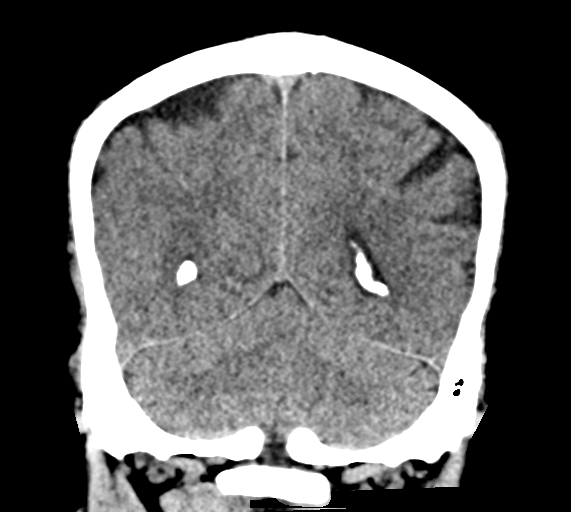
[im 33/74  brain]
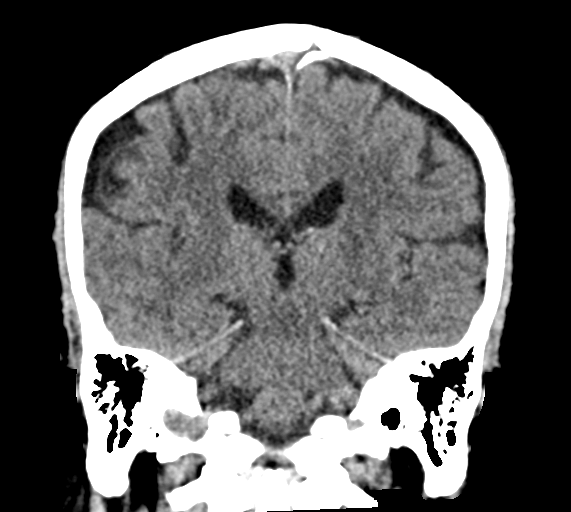
[im 41/74  brain]
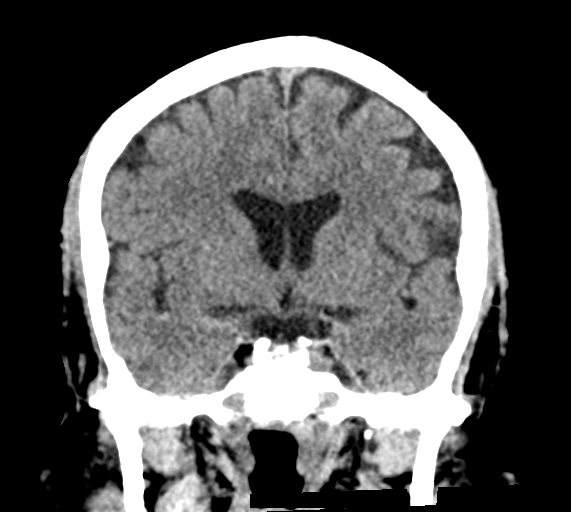

[Series 5: sag soft · sagittal · 0.32mm/px · 3 of 57 slices shown]
[im 19/57  brain]
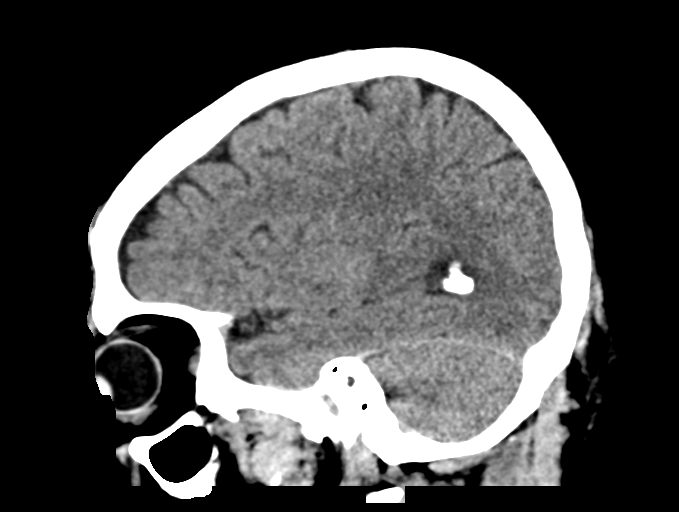
[im 29/57  brain]
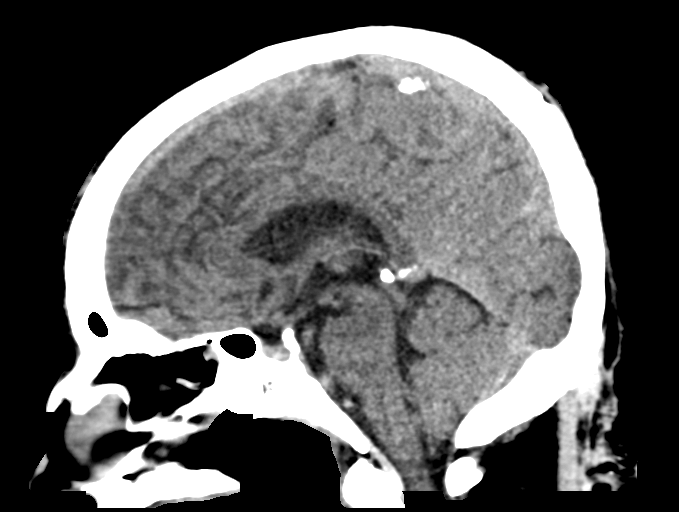
[im 38/57  brain]
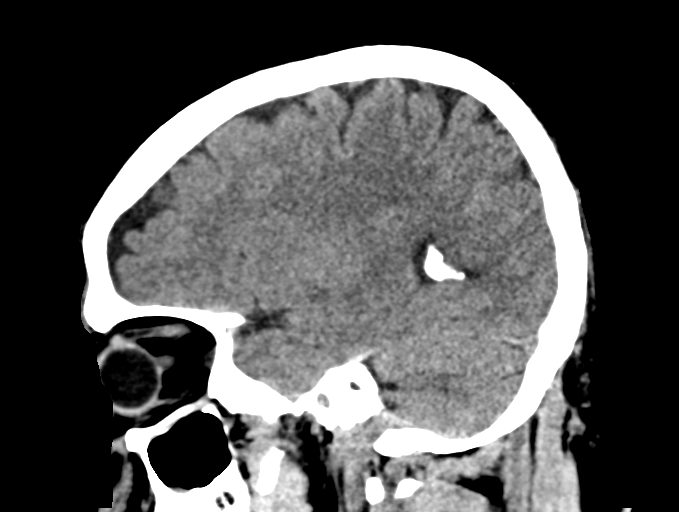

[15 of 47 positions shown; findings below may reference images not displayed]

FINDINGS: Brain: No evidence of acute infarction, hemorrhage, hydrocephalus,
extra-axial collection or mass lesion/mass effect.

Vascular: No hyperdense vessel or unexpected calcification.

Skull: Normal. Negative for fracture or focal lesion.

Sinuses/Orbits: There is mild bilateral ethmoid sinus mucosal
thickening. Bilateral maxillary sinus polyps versus mucous retention
cysts are seen.

Other: None.
IMPRESSION: No acute intracranial abnormality.

## 2022-11-23 ENCOUNTER — Other Ambulatory Visit: Payer: Self-pay | Admitting: Cardiology

## 2022-12-05 DIAGNOSIS — R972 Elevated prostate specific antigen [PSA]: Secondary | ICD-10-CM | POA: Insufficient documentation

## 2022-12-09 ENCOUNTER — Encounter: Payer: Self-pay | Admitting: Internal Medicine

## 2023-01-04 ENCOUNTER — Other Ambulatory Visit: Payer: Self-pay | Admitting: Cardiology

## 2023-01-07 ENCOUNTER — Telehealth: Payer: Self-pay | Admitting: *Deleted

## 2023-01-07 NOTE — Telephone Encounter (Signed)
S/w pt due to pt needing a refill on Coreg one (1) tablet by mouth ( 25 mg) bid and pt has not been seen in the office since 2021.  [facility]  Disregard paper refill. Will dispose of refill.

## 2023-02-17 ENCOUNTER — Ambulatory Visit (AMBULATORY_SURGERY_CENTER): Payer: No Typology Code available for payment source

## 2023-02-17 VITALS — Ht 66.0 in | Wt 196.4 lb

## 2023-02-17 DIAGNOSIS — Z029 Encounter for administrative examinations, unspecified: Secondary | ICD-10-CM | POA: Insufficient documentation

## 2023-02-17 DIAGNOSIS — Z7189 Other specified counseling: Secondary | ICD-10-CM | POA: Insufficient documentation

## 2023-02-17 DIAGNOSIS — K036 Deposits [accretions] on teeth: Secondary | ICD-10-CM | POA: Insufficient documentation

## 2023-02-17 DIAGNOSIS — K5730 Diverticulosis of large intestine without perforation or abscess without bleeding: Secondary | ICD-10-CM | POA: Insufficient documentation

## 2023-02-17 DIAGNOSIS — I252 Old myocardial infarction: Secondary | ICD-10-CM | POA: Insufficient documentation

## 2023-02-17 DIAGNOSIS — N5082 Scrotal pain: Secondary | ICD-10-CM | POA: Insufficient documentation

## 2023-02-17 DIAGNOSIS — R972 Elevated prostate specific antigen [PSA]: Secondary | ICD-10-CM | POA: Insufficient documentation

## 2023-02-17 DIAGNOSIS — M75100 Unspecified rotator cuff tear or rupture of unspecified shoulder, not specified as traumatic: Secondary | ICD-10-CM | POA: Insufficient documentation

## 2023-02-17 DIAGNOSIS — Z23 Encounter for immunization: Secondary | ICD-10-CM | POA: Insufficient documentation

## 2023-02-17 DIAGNOSIS — K649 Unspecified hemorrhoids: Secondary | ICD-10-CM | POA: Insufficient documentation

## 2023-02-17 DIAGNOSIS — Z460 Encounter for fitting and adjustment of spectacles and contact lenses: Secondary | ICD-10-CM | POA: Insufficient documentation

## 2023-02-17 DIAGNOSIS — K0263 Dental caries on smooth surface penetrating into pulp: Secondary | ICD-10-CM | POA: Insufficient documentation

## 2023-02-17 DIAGNOSIS — R6889 Other general symptoms and signs: Secondary | ICD-10-CM | POA: Insufficient documentation

## 2023-02-17 DIAGNOSIS — Z1211 Encounter for screening for malignant neoplasm of colon: Secondary | ICD-10-CM

## 2023-02-17 DIAGNOSIS — G43111 Migraine with aura, intractable, with status migrainosus: Secondary | ICD-10-CM | POA: Insufficient documentation

## 2023-02-17 DIAGNOSIS — H40013 Open angle with borderline findings, low risk, bilateral: Secondary | ICD-10-CM | POA: Insufficient documentation

## 2023-02-17 MED ORDER — PEG 3350-KCL-NA BICARB-NACL 420 G PO SOLR: 4000.0000 mL | Freq: Once | ORAL | 0 refills | Status: AC

## 2023-02-17 NOTE — Progress Notes (Signed)
No egg or soy allergy known to patient  No issues known to pt with past sedation with any surgeries or procedures Patient denies ever being told they had issues or difficulty with intubation  No FH of Malignant Hyperthermia Pt is not on diet pills Pt is not on  home 02  Pt is not on blood thinners  Pt denies issues with constipation  No A fib or A flutter Have any cardiac testing pending--no  LOA: independent  PREP: golytely   PV competed with patient and wife . Prep instructions sent via MyChart, hard copy given at time of visit. All questions answered.

## 2023-03-02 ENCOUNTER — Ambulatory Visit: Payer: No Typology Code available for payment source | Admitting: Internal Medicine

## 2023-03-02 ENCOUNTER — Encounter: Payer: Self-pay | Admitting: Internal Medicine

## 2023-03-02 VITALS — BP 132/85 | HR 57 | Temp 97.1°F | Resp 21 | Ht 66.0 in | Wt 196.4 lb

## 2023-03-02 DIAGNOSIS — Z1211 Encounter for screening for malignant neoplasm of colon: Secondary | ICD-10-CM

## 2023-03-02 MED ORDER — SODIUM CHLORIDE 0.9 % IV SOLN: 500.0000 mL | Freq: Once | INTRAVENOUS | Status: DC

## 2023-03-02 NOTE — Patient Instructions (Addendum)
Please read handouts provided. Continue present medications . Repeat colonoscopy in 10 years for screening.   YOU HAD AN ENDOSCOPIC PROCEDURE TODAY AT THE [facility]:   Refer to the procedure report that was given to you for any specific questions about what was found during the examination.  If the procedure report does not answer your questions, please call your gastroenterologist to clarify.  If you requested that your care partner not be given the details of your procedure findings, then the procedure report has been included in a sealed envelope for you to review at your convenience later.  YOU SHOULD EXPECT: Some feelings of bloating in the abdomen. Passage of more gas than usual.  Walking can help get rid of the air that was put into your GI tract during the procedure and reduce the bloating. If you had a lower endoscopy (such as a colonoscopy or flexible sigmoidoscopy) you may notice spotting of blood in your stool or on the toilet paper. If you underwent a bowel prep for your procedure, you may not have a normal bowel movement for a few days.  Please Note:  You might notice some irritation and congestion in your nose or some drainage.  This is from the oxygen used during your procedure.  There is no need for concern and it should clear up in a day or so.  SYMPTOMS TO REPORT IMMEDIATELY:  Following lower endoscopy (colonoscopy or flexible sigmoidoscopy):  Excessive amounts of blood in the stool  Significant tenderness or worsening of abdominal pains  Swelling of the abdomen that is new, acute  Fever of 100F or higher  For urgent or emergent issues, a gastroenterologist can be reached at any hour by calling [phone]. Do not use MyChart messaging for urgent concerns.    DIET:  We do recommend a small meal at first, but then you may proceed to your regular diet.  Drink plenty of fluids but you should avoid alcoholic beverages for 24 hours.  ACTIVITY:  You should  plan to take it easy for the rest of today and you should NOT DRIVE or use heavy machinery until tomorrow (because of the sedation medicines used during the test).    FOLLOW UP: Our staff will call the number listed on your records the next business day following your procedure.  We will call around 7:15- 8:00 am to check on you and address any questions or concerns that you may have regarding the information given to you following your procedure. If we do not reach you, we will leave a message.     If any biopsies were taken you will be contacted by phone or by letter within the next 1-3 weeks.  Please call us at [phone] if you have not heard about the biopsies in 3 weeks.    SIGNATURES/CONFIDENTIALITY: You and/or your care partner have signed paperwork which will be entered into your electronic medical record.  These signatures attest to the fact that that the information above on your After Visit Summary has been reviewed and is understood.  Full responsibility of the confidentiality of this discharge information lies with you and/or your care-partner.I am please to tell you no polyps or cancer were found.  You do have diverticulosis - thickened muscle rings and pouches in the colon wall. Please read the handout about this condition. Hemorrhoids also seen again.  Next routine colonoscopy or other screening test in 10 years - 2034.  I appreciate the opportunity to care for  you. Iva Boop, MD, [facility]

## 2023-03-02 NOTE — Op Note (Signed)
[facility] Patient Name: Henry Perry Procedure Date: 03/02/2023 9:03 AM MRN: [id number] Endoscopist: Iva Boop , MD, [id number] Age: 62 Referring MD:  Date of Birth: 1961-02-06 Gender: Male Account #: [id number] Procedure:                Colonoscopy Indications:              Screening for colorectal malignant neoplasm, Last                            colonoscopy: 2014 Medicines:                Monitored Anesthesia Care Procedure:                Pre-Anesthesia Assessment:                           - Prior to the procedure, a History and Physical                            was performed, and patient medications and                            allergies were reviewed. The patient's tolerance of                            previous anesthesia was also reviewed. The risks                            and benefits of the procedure and the sedation                            options and risks were discussed with the patient.                            All questions were answered, and informed consent                            was obtained. Prior Anticoagulants: The patient has                            taken no anticoagulant or antiplatelet agents. ASA                            Grade Assessment: II - A patient with mild systemic                            disease. After reviewing the risks and benefits,                            the patient was deemed in satisfactory condition to                            undergo the procedure.  After obtaining informed consent, the colonoscope                            was passed under direct vision. Throughout the                            procedure, the patient's blood pressure, pulse, and                            oxygen saturations were monitored continuously. The                            CF [id number] #[id number] was introduced through the anus                            and advanced to the the cecum,  identified by                            appendiceal orifice and ileocecal valve. The                            colonoscopy was performed without difficulty. The                            patient tolerated the procedure well. The quality                            of the bowel preparation was good. The ileocecal                            valve, appendiceal orifice, and rectum were                            photographed. The bowel preparation used was                            GoLYTELY via split dose instruction. Scope In: 9:16:12 AM Scope Out: 9:29:18 AM Scope Withdrawal Time: 0 hours 11 minutes 2 seconds  Total Procedure Duration: 0 hours 13 minutes 6 seconds  Findings:                 The perianal and digital rectal examinations were                            normal. Pertinent negatives include normal prostate                            (size, shape, and consistency).                           Multiple diverticula were found in the sigmoid                            colon.  Internal hemorrhoids were found.                           The exam was otherwise without abnormality. Complications:            No immediate complications. Estimated Blood Loss:     Estimated blood loss: none. Impression:               - Diverticulosis in the sigmoid colon.                           - Internal hemorrhoids.                           - The examination was otherwise normal.                           - No specimens collected. Recommendation:           - Patient has a contact number available for                            emergencies. The signs and symptoms of potential                            delayed complications were discussed with the                            patient. Return to normal activities tomorrow.                            Written discharge instructions were provided to the                            patient.                           - Resume previous  diet.                           - Continue present medications.                           - Repeat colonoscopy in 10 years for screening                            purposes. Iva Boop, MD 03/02/2023 9:34:12 AM This report has been signed electronically.

## 2023-03-02 NOTE — Progress Notes (Signed)
Sedate, gd SR, tolerated procedure well, VSS, report to RN 

## 2023-03-02 NOTE — Progress Notes (Signed)
[facility] History and Physical   Primary Care Physician:  Clinic, [facility]   Reason for Procedure:    Encounter Diagnosis  Name Primary?   Special screening for malignant neoplasms, colon Yes     Plan:    colonoscopy     HPI: Henry Perry is a 62 y.o. male for screening exam S/p  colonoscopy 2014 - hemorrhoids and diverticulosis - no polyps   Past Medical History:  Diagnosis Date   Acute myocardial infarction, unspecified site, episode of care unspecified    Coronary atherosclerosis of unspecified type of vessel, native or graft    RCA DES x 3 2007   Diabetes mellitus    non insulin dependent.   No D.R on exam 09/2009 or 09/01/11.   Diverticulosis of colon (without mention of hemorrhage)    Admitted 01/2008   Esophageal reflux    ED visit 10/2009 for globus pharyngeus   Glaucoma 09/2009   Pressures normal but slight nerve thinning and corneal thickening noted on ophth exam.   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  [facility] abd [facility] showed benign cystic lesion.   Obesity, unspecified    OSA (obstructive sleep apnea) 02/2012   Mild; recommended CPAP titration/trial as of 03/22/12.   Unspecified hemorrhoids without mention of complication    Vitamin D deficiency 05/2010   [facility]    Past Surgical History:  Procedure Laterality Date   CHOLECYSTECTOMY     COLONOSCOPY  multiple; most recent 10/28/12   Diverticulosis, moderate internal hemorrhoids.   CORONARY ANGIOPLASTY WITH STENT PLACEMENT  2007   DES x 3 to RCA.     CORONARY PRESSURE/FFR STUDY N/A 05/07/2020   Procedure: INTRAVASCULAR PRESSURE WIRE/FFR STUDY;  Surgeon: Kathleene Hazel, MD;  Location: [facility] INVASIVE CV [facility];  Service: Cardiovascular;  Laterality: N/A;   LEFT HEART  CATH AND CORONARY ANGIOGRAPHY N/A 05/07/2020   Procedure: LEFT HEART CATH AND CORONARY ANGIOGRAPHY;  Surgeon: Kathleene Hazel, MD;  Location: [facility] INVASIVE CV LAB;  Service: Cardiovascular;  Laterality: N/A;    Prior to Admission medications   Medication Sig Start Date End Date Taking? Authorizing Provider  aspirin 81 MG tablet Take 81 mg by mouth daily with lunch.    Yes [provider]  atorvastatin (LIPITOR) 40 MG tablet Take 40 mg by mouth daily. 05/16/22  Yes [provider]  carvedilol (COREG) 25 MG tablet TAKE 1 TABLET(25 MG) BY MOUTH TWICE DAILY WITH A MEAL 01/05/23  Yes Crenshaw, Madolyn Frieze, MD  empagliflozin (JARDIANCE) 25 MG TABS tablet Take 25 mg by mouth daily. 01/29/23  Yes [provider]  finasteride (PROSCAR) 5 MG tablet Take 5 mg by mouth daily with lunch.    Yes [provider]  hydrochlorothiazide (HYDRODIURIL) 25 MG tablet Take 1 tablet (25 mg total) by mouth daily. 05/02/13  Yes McGowen, Maryjean Morn, MD  metFORMIN (GLUCOPHAGE) 1000 MG tablet Take 1 tablet (1,000 mg total) by mouth 2 (two) times daily with a meal. Patient taking differently: Take 1,000 mg by mouth every evening. 07/06/19 03/02/23 Yes Curatolo, Adam, DO  VITAMIN D PO Take 2,000 Units by mouth daily.    Yes [provider]  Vitamin D-Vitamin K (VITAMIN K2-VITAMIN D3 PO) Take 1 tablet by  mouth daily.   Yes [provider]  Alirocumab (PRALUENT) 150 MG/ML SOAJ Inject 150 mg into the skin every 14 (fourteen) days. 10/05/20   Lewayne Bunting, MD  cetirizine (ZYRTEC) 10 MG tablet Take 10 mg by mouth daily as needed.    [provider]  diclofenac Sodium (VOLTAREN) 1 % GEL Apply 4 g topically 2 (two) times daily as needed. 10/30/22   [provider]  Glucose 4-6 GM-MG CHEW Chew by mouth as needed.  CHEW FOUR TABLETS BY MOUTH AS NEEDED FOR HYPOGLYCEMIA - TAKE FOR BLOOD SUGAR LESS THAN 70 (REPEAT EVERY 15 MINUTES IF BLOOD SUGAR REMAINS LESS THAN 70) - TAKE  FOR BLOOD SUGAR LESS THAN 70 (REPEAT EVERY 15 MINUTES IF BLOOD SUGAR REMAINS LESS THAN 70) 11/24/22   [provider]  ibuprofen (ADVIL) 200 MG tablet Take 400 mg by mouth.    [provider]  isosorbide mononitrate (IMDUR) 30 MG 24 hr tablet Take 1 tablet (30 mg total) by mouth daily. Patient not taking: Reported on 02/17/2023 04/17/20 07/16/20  Lewayne Bunting, MD  nitroGLYCERIN (NITROSTAT) 0.4 MG SL tablet Place 1 tablet (0.4 mg total) under the tongue every 5 (five) minutes as needed for chest pain. Patient not taking: Reported on 02/17/2023 04/29/18 07/23/20  Abelino Derrick, PA-C  Rimegepant Sulfate 75 MG TBDP Take 75 mg by mouth as needed (MIGRANE).    [provider]  Turmeric 500 MG CAPS Take 1,000 mg by mouth daily. Patient not taking: Reported on 02/17/2023    [provider]  vitamin B-12 (CYANOCOBALAMIN) 500 MCG tablet Take 500 mcg by mouth daily. Patient not taking: Reported on 02/17/2023    [provider]    Current Outpatient Medications  Medication Sig Dispense Refill   aspirin 81 MG tablet Take 81 mg by mouth daily with lunch.      atorvastatin (LIPITOR) 40 MG tablet Take 40 mg by mouth daily.     carvedilol (COREG) 25 MG tablet TAKE 1 TABLET(25 MG) BY MOUTH TWICE DAILY WITH A MEAL 60 tablet 0   empagliflozin (JARDIANCE) 25 MG TABS tablet Take 25 mg by mouth daily.     finasteride (PROSCAR) 5 MG tablet Take 5 mg by mouth daily with lunch.      hydrochlorothiazide (HYDRODIURIL) 25 MG tablet Take 1 tablet (25 mg total) by mouth daily. 90 tablet 1   metFORMIN (GLUCOPHAGE) 1000 MG tablet Take 1 tablet (1,000 mg total) by mouth 2 (two) times daily with a meal. (Patient taking differently: Take 1,000 mg by mouth every evening.) 60 tablet 0   VITAMIN D PO Take 2,000 Units by mouth daily.      Vitamin D-Vitamin K (VITAMIN K2-VITAMIN D3 PO) Take 1 tablet by mouth daily.     Alirocumab (PRALUENT) 150 MG/ML SOAJ Inject 150 mg into the skin every 14  (fourteen) days. 6 mL 3   cetirizine (ZYRTEC) 10 MG tablet Take 10 mg by mouth daily as needed.     diclofenac Sodium (VOLTAREN) 1 % GEL Apply 4 g topically 2 (two) times daily as needed.     Glucose 4-6 GM-MG CHEW Chew by mouth as needed.  CHEW FOUR TABLETS BY MOUTH AS NEEDED FOR HYPOGLYCEMIA - TAKE FOR BLOOD SUGAR LESS THAN 70 (REPEAT EVERY 15 MINUTES IF BLOOD SUGAR REMAINS LESS THAN 70) - TAKE FOR BLOOD SUGAR LESS THAN 70 (REPEAT EVERY 15 MINUTES IF BLOOD SUGAR REMAINS LESS THAN 70)     ibuprofen (ADVIL) 200  MG tablet Take 400 mg by mouth.     isosorbide mononitrate (IMDUR) 30 MG 24 hr tablet Take 1 tablet (30 mg total) by mouth daily. (Patient not taking: Reported on 02/17/2023) 90 tablet 3   nitroGLYCERIN (NITROSTAT) 0.4 MG SL tablet Place 1 tablet (0.4 mg total) under the tongue every 5 (five) minutes as needed for chest pain. (Patient not taking: Reported on 02/17/2023) 25 tablet 3   Rimegepant Sulfate 75 MG TBDP Take 75 mg by mouth as needed (MIGRANE).     Turmeric 500 MG CAPS Take 1,000 mg by mouth daily. (Patient not taking: Reported on 02/17/2023)     vitamin B-12 (CYANOCOBALAMIN) 500 MCG tablet Take 500 mcg by mouth daily. (Patient not taking: Reported on 02/17/2023)     Current Facility-Administered Medications  Medication Dose Route Frequency Provider Last Rate Last Admin   0.9 %  sodium chloride infusion  500 mL Intravenous Once Iva Boop, MD        Allergies as of 03/02/2023   (No Known Allergies)    Family History  Problem Relation Age of Onset   Prostate cancer Father    Diabetes Father    Colon cancer Neg Hx    Colon polyps Neg Hx    Esophageal cancer Neg Hx    Rectal cancer Neg Hx    Stomach cancer Neg Hx     Social History   Socioeconomic History   Marital status: Married    Spouse name: Not on file   Number of children: 2   Years of education: Not on file   Highest education level: Not on file  Occupational History   Occupation: Social worker     Comment: real estate  Tobacco Use   Smoking status: Former    Types: Cigarettes    Quit date: 08/25/1990    Years since quitting: 32.5   Smokeless tobacco: Never  Vaping Use   Vaping Use: Never used  Substance and Sexual Activity   Alcohol use: No   Drug use: No   Sexual activity: Not on file  Other Topics Concern   Not on file  Social History Narrative   Occupation: Psychologist, 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    Alcohol Use - no   Daily Caffeine Use -   Illicit Drug Use - no   Married, 1 son, 1 daughter.  Lives in [place].  Originally from [place] but lived in [place] a while before moving back to [place].   Regular exercise: just started                  Social Determinants of Health   Financial Resource Strain: Not on file  Food Insecurity: Not on file  Transportation Needs: Not on file  Physical Activity: Not on file  Stress: Not on file  Social Connections: Not on file  Intimate Partner Violence: Not on file    Review of Systems:  All other review of systems negative except as mentioned in the HPI.  Physical Exam: Vital signs BP 137/78   Pulse (!) 58   Temp (!) 97.1 F (36.2 C) (Skin)   Ht 5\' 6"  (1.676 m)   Wt 196 lb 6.4 oz (89.1 kg)   SpO2 99%   BMI 31.70 kg/m   General:   Alert,  Well-developed, well-nourished, pleasant and cooperative in NAD Lungs:  Clear throughout to auscultation.   Heart:  Regular rate and rhythm;  no murmurs, clicks, rubs,  or gallops. Abdomen:  Soft, nontender and nondistended. Normal bowel sounds.   Neuro/Psych:  Alert and cooperative. Normal mood and affect. A and O x 3   @Sudeep Scheibel  Sena Slate, MD, [facility] [phone] (pager) 03/02/2023 9:07 AM@

## 2023-03-02 NOTE — Progress Notes (Signed)
Pt's states no medical or surgical changes since previsit or office visit. VS assessed by D.T 

## 2023-03-03 ENCOUNTER — Telehealth: Payer: Self-pay

## 2023-03-03 NOTE — Telephone Encounter (Signed)
  Follow up Call-     03/02/2023    8:22 AM  Call back number  Post procedure Call Back phone  # [phone]  Permission to leave phone message Yes     Left message
